# Patient Record
Sex: Female | Born: 1970 | Race: White | Hispanic: No | Marital: Married | Smoking: Never smoker
Health system: Southern US, Community
[De-identification: ages and names within clinical notes are randomized; demographics above are authoritative.]

## PROBLEM LIST (undated history)

## (undated) DIAGNOSIS — H6980 Other specified disorders of Eustachian tube, unspecified ear: Secondary | ICD-10-CM

## (undated) DIAGNOSIS — F411 Generalized anxiety disorder: Secondary | ICD-10-CM

## (undated) DIAGNOSIS — K900 Celiac disease: Secondary | ICD-10-CM

## (undated) DIAGNOSIS — H6990 Unspecified Eustachian tube disorder, unspecified ear: Secondary | ICD-10-CM

## (undated) DIAGNOSIS — I059 Rheumatic mitral valve disease, unspecified: Secondary | ICD-10-CM

## (undated) DIAGNOSIS — G43909 Migraine, unspecified, not intractable, without status migrainosus: Secondary | ICD-10-CM

## (undated) HISTORY — DX: Celiac disease: K90.0

## (undated) HISTORY — DX: Other specified disorders of Eustachian tube, unspecified ear: H69.80

## (undated) HISTORY — DX: Rheumatic mitral valve disease, unspecified: I05.9

## (undated) HISTORY — PX: ABDOMINAL HYSTERECTOMY: SHX81

## (undated) HISTORY — DX: Generalized anxiety disorder: F41.1

## (undated) HISTORY — DX: Unspecified eustachian tube disorder, unspecified ear: H69.90

## (undated) HISTORY — PX: ANKLE SURGERY: SHX546

## (undated) HISTORY — PX: TONSILLECTOMY: SUR1361

## (undated) HISTORY — DX: Migraine, unspecified, not intractable, without status migrainosus: G43.909

---

## 2008-07-18 DIAGNOSIS — I079 Rheumatic tricuspid valve disease, unspecified: Secondary | ICD-10-CM | POA: Insufficient documentation

## 2008-08-21 DIAGNOSIS — F411 Generalized anxiety disorder: Secondary | ICD-10-CM | POA: Insufficient documentation

## 2008-08-21 DIAGNOSIS — H6980 Other specified disorders of Eustachian tube, unspecified ear: Secondary | ICD-10-CM | POA: Insufficient documentation

## 2009-02-20 DIAGNOSIS — G43909 Migraine, unspecified, not intractable, without status migrainosus: Secondary | ICD-10-CM | POA: Insufficient documentation

## 2009-05-30 DIAGNOSIS — H01009 Unspecified blepharitis unspecified eye, unspecified eyelid: Secondary | ICD-10-CM | POA: Insufficient documentation

## 2013-04-19 DIAGNOSIS — K900 Celiac disease: Secondary | ICD-10-CM | POA: Insufficient documentation

## 2014-02-01 ENCOUNTER — Other Ambulatory Visit: Payer: Self-pay | Admitting: Gastroenterology

## 2014-02-01 DIAGNOSIS — R1011 Right upper quadrant pain: Secondary | ICD-10-CM

## 2014-02-01 DIAGNOSIS — R110 Nausea: Secondary | ICD-10-CM

## 2014-02-15 ENCOUNTER — Ambulatory Visit (HOSPITAL_COMMUNITY)
Admission: RE | Admit: 2014-02-15 | Discharge: 2014-02-15 | Disposition: A | Discharge status: Home / Self Care | Payer: 59 | Source: Ambulatory Visit | Attending: Gastroenterology | Admitting: Gastroenterology

## 2014-02-15 DIAGNOSIS — R110 Nausea: Secondary | ICD-10-CM

## 2014-02-15 DIAGNOSIS — R1011 Right upper quadrant pain: Secondary | ICD-10-CM

## 2014-02-15 MED ORDER — TECHNETIUM TC 99M MEBROFENIN IV KIT
5.3000 | PACK | Freq: Once | INTRAVENOUS | Status: AC | PRN
  Administered 2014-02-15: 5 via INTRAVENOUS

## 2017-03-29 DIAGNOSIS — R6882 Decreased libido: Secondary | ICD-10-CM | POA: Insufficient documentation

## 2017-03-29 DIAGNOSIS — N951 Menopausal and female climacteric states: Secondary | ICD-10-CM | POA: Insufficient documentation

## 2019-10-10 ENCOUNTER — Other Ambulatory Visit: Payer: Self-pay

## 2019-10-10 ENCOUNTER — Ambulatory Visit: Payer: 59 | Admitting: Podiatry

## 2019-10-10 ENCOUNTER — Encounter: Payer: Self-pay | Admitting: Podiatry

## 2019-10-10 ENCOUNTER — Ambulatory Visit (INDEPENDENT_AMBULATORY_CARE_PROVIDER_SITE_OTHER): Payer: 59

## 2019-10-10 VITALS — BP 149/94 | HR 87 | Resp 16

## 2019-10-10 DIAGNOSIS — S93402A Sprain of unspecified ligament of left ankle, initial encounter: Secondary | ICD-10-CM

## 2019-10-10 NOTE — Progress Notes (Signed)
  Subjective:  Patient ID: Janice Willis, female    DOB: Feb 12, 1971,  MRN: [id number] HPI Chief Complaint  Patient presents with  . Ankle Injury    Lateral ankle/achilles left - patient fell on Nov 14th and twisted ankle, swollen and bruised, PCP xrayed "no fracture", wearing CAM boot, still very sore and wasn't happy with her follow up visit and would like a 2nd opinion  . New Patient (Initial Visit)    48 y.o. female presents with the above complaint.   ROS: Denies fever chills nausea vomiting muscle aches pains calf pain back pain chest pain shortness of breath.  No past medical history on file.  No current outpatient medications on file.  Allergies  Allergen Reactions  . Codeine Rash  . Fluoxetine Hcl     Flat affect  . Methylprednisolone Hives    Medrol Dose Pack  . Penicillins Rash  . Venlafaxine     Felt like Zombee  . Ciprofloxacin   . Clindamycin/Lincomycin   . Other   . Prednisone   . Propoxyphene   . Sulfamethoxazole-Trimethoprim    Review of Systems Objective:   Vitals:   10/10/19 1546  BP: (!) 149/94  Pulse: 87  Resp: 16    General: Well developed, nourished, in no acute distress, alert and oriented x3   Dermatological: Skin is warm, dry and supple bilateral. Nails x 10 are well maintained; remaining integument appears unremarkable at this time. There are no open sores, no preulcerative lesions, no rash or signs of infection present.  Vascular: Dorsalis Pedis artery and Posterior Tibial artery pedal pulses are 2/4 bilateral with immedate capillary fill time. Pedal hair growth present. No varicosities and no lower extremity edema present bilateral.   Neruologic: Grossly intact via light touch bilateral. Vibratory intact via tuning fork bilateral. Protective threshold with Semmes Wienstein monofilament intact to all pedal sites bilateral. Patellar and Achilles deep tendon reflexes 2+ bilateral. No Babinski or clonus noted bilateral.   Musculoskeletal:  No gross boney pedal deformities bilateral. No pain, crepitus, or limitation noted with foot and ankle range of motion bilateral. Muscular strength 5/5 in all groups tested bilateral.  She has pain primarily on palpation of the lateral malleolus and she has pain on direct palpation of the anterior talofibular ligament.  Calcaneofibular ligament and posterior talofibular ligament not involved.  Gait: Unassisted, Nonantalgic.    Radiographs:  Radiographs taken today demonstrate some soft tissue swelling around the lateral malleolus.  There appears to be a small vertical fracture line from the distal malleolus to a more transverse fracture line proximally at the neck of the fibula inferior aspect.  This is noncomminuted it is not visible on all 3 views.  Lateral view does demonstrate swelling of the anterior ankle.  No displacement of the ankle no talar tilt.  Assessment & Plan:   Assessment: Level 3 ankle sprain left.  Plan: At this point I would like to place her in a compression anklet and then place her in a short cam walker so that she can ambulate more adequately.  We did discuss compression ice therapy rest and elevation.  She understands that and is amenable to it.  We are going to write her limited duty at work.  I will follow-up with her early February to reevaluate and make sure that we do not need to do surgery for this anterior talofibular ligament.  She will call sooner with questions or concerns.     Brylynn Hanssen T. [place], [place]

## 2019-10-25 ENCOUNTER — Encounter: Payer: Self-pay | Admitting: Podiatry

## 2019-10-26 ENCOUNTER — Other Ambulatory Visit: Payer: Self-pay | Admitting: Podiatry

## 2019-10-26 ENCOUNTER — Telehealth: Payer: Self-pay | Admitting: *Deleted

## 2019-10-26 MED ORDER — TRAMADOL HCL 50 MG PO TABS: 50.0000 mg | ORAL_TABLET | Freq: Four times a day (QID) | ORAL | 0 refills | Status: AC | PRN

## 2019-10-26 NOTE — Telephone Encounter (Signed)
Pt states she is having a lot of pain in the ankle is worse and the advil is not helping.

## 2019-10-26 NOTE — Telephone Encounter (Signed)
Left message informing pt the pain medication had been sent to [facility].

## 2019-10-26 NOTE — Telephone Encounter (Signed)
Ill send tramadol

## 2019-10-31 ENCOUNTER — Ambulatory Visit (INDEPENDENT_AMBULATORY_CARE_PROVIDER_SITE_OTHER): Payer: Managed Care, Other (non HMO) | Admitting: Podiatry

## 2019-10-31 ENCOUNTER — Other Ambulatory Visit: Payer: Self-pay

## 2019-10-31 ENCOUNTER — Telehealth: Payer: Self-pay | Admitting: *Deleted

## 2019-10-31 ENCOUNTER — Encounter: Payer: Self-pay | Admitting: Podiatry

## 2019-10-31 DIAGNOSIS — M958 Other specified acquired deformities of musculoskeletal system: Secondary | ICD-10-CM

## 2019-10-31 DIAGNOSIS — S93402A Sprain of unspecified ligament of left ankle, initial encounter: Secondary | ICD-10-CM

## 2019-10-31 DIAGNOSIS — S86312D Strain of muscle(s) and tendon(s) of peroneal muscle group at lower leg level, left leg, subsequent encounter: Secondary | ICD-10-CM

## 2019-10-31 DIAGNOSIS — S93492D Sprain of other ligament of left ankle, subsequent encounter: Secondary | ICD-10-CM

## 2019-10-31 NOTE — Telephone Encounter (Signed)
-----   Message from Kristian Covey, [facility] sent at 10/31/2019 12:05 PM EST ----- Regarding: MRI MRI left ankle - evaluate for osteochondral defect, ATFL and peroneal tears left ankle - surgical consideration

## 2019-10-31 NOTE — Telephone Encounter (Signed)
Orders to A. Prevette, CMA for pre-cert, faxed to [facility]. 

## 2019-11-01 ENCOUNTER — Encounter: Payer: Self-pay | Admitting: Podiatry

## 2019-11-01 NOTE — Progress Notes (Signed)
She presents today for follow-up of her left ankle.  She has been wearing the cam walker but states that the injury that she sustained November 14 when she fell down her steps at home as really started become more painful and is starting to affect her ability to perform any activities.  She saw her primary doctor on 16 November who examined her and told her she wanted her to start wearing a brace.  She examined it again on the 19th and wanted her to start physical therapy.  She then came to [place] on the 22nd.  Objective: Vital signs are stable she is alert and oriented x3 pulses are palpable.  Left ankle remains swollen she has exquisite tenderness overlying the anterior talofibular ligament and the peroneal tendons.  Medial aspect of the foot appears to be good though she does have some tenderness on dorsiflexion and plantarflexion of the foot near the anterior lateral ankle.  Possible osteochondral defect at that point.  Assessment: Cannot rule out osteochondral defect a tear of the anterior talofibular ligament and peroneal tendons.  Plan: Failure to improve over 2 months.  We should go ahead and get an MRI of this ankle to make sure there is no surgical implications.

## 2019-11-05 ENCOUNTER — Encounter: Payer: Self-pay | Admitting: Podiatry

## 2019-11-07 ENCOUNTER — Other Ambulatory Visit: Payer: Self-pay | Admitting: Podiatry

## 2019-11-07 ENCOUNTER — Ambulatory Visit: Payer: 59 | Admitting: Podiatry

## 2019-11-07 MED ORDER — OXYCODONE-ACETAMINOPHEN 10-325 MG PO TABS: 1.0000 | ORAL_TABLET | Freq: Three times a day (TID) | ORAL | 0 refills | Status: AC | PRN

## 2019-11-08 NOTE — Telephone Encounter (Signed)
Orders and [employer] referral # faxed to [facility].

## 2019-11-08 NOTE — Telephone Encounter (Signed)
According to [employer] ([phone]) this MRI does not require pre-cert or prior authorization. I asked for a reference number for this and representative said to use her name with the date...  Ref: Janice Willis 11/08/2019

## 2019-11-10 ENCOUNTER — Encounter: Payer: Self-pay | Admitting: Podiatry

## 2019-11-10 ENCOUNTER — Other Ambulatory Visit: Payer: Self-pay

## 2019-11-10 ENCOUNTER — Ambulatory Visit
Admission: RE | Admit: 2019-11-10 | Discharge: 2019-11-10 | Disposition: A | Discharge status: Home / Self Care | Payer: 59 | Source: Ambulatory Visit | Attending: Podiatry | Admitting: Podiatry

## 2019-11-14 ENCOUNTER — Telehealth: Payer: Self-pay | Admitting: *Deleted

## 2019-11-14 NOTE — Telephone Encounter (Signed)
Fine with me

## 2019-11-14 NOTE — Telephone Encounter (Signed)
Mailed copy of the MRI disc to [facility].

## 2019-11-14 NOTE — Telephone Encounter (Signed)
I called pt and informed that Dr. Al Corpus had okayed the extension of out of work date to 11/28/2019. Pt asked if the MRI results were inconclusive is why the MRI was being sent out. I told pt Dr. Al Corpus will often send the MRI disc copy to the specialist for more details to plan treatment. Pt states the man that talked with her this morning gave her a surgery book and pack. I told her I was not aware of that and it may have been sent by the surgery coordinator. I told pt I would have her letter ready for pick up.

## 2019-11-14 NOTE — Telephone Encounter (Signed)
I spoke with pt and informed of Dr. Geryl Rankins request to send copy of the MRI disc to a radiology specialist for more details for treatment planning, there would be a 10 -14 day delay in the final results, and once reviewed I would call with instructions. Pt request to be out of work until 2 weeks for the results to return.

## 2019-11-14 NOTE — Telephone Encounter (Signed)
-----   Message from Elinor Parkinson, [place] sent at 11/14/2019  7:10 AM EST ----- Please send for over read and inform patient of the delay.

## 2019-11-23 ENCOUNTER — Encounter: Payer: Self-pay | Admitting: Podiatry

## 2019-11-27 ENCOUNTER — Encounter: Payer: Self-pay | Admitting: Podiatry

## 2019-11-29 ENCOUNTER — Encounter: Payer: Self-pay | Admitting: Podiatry

## 2019-11-30 ENCOUNTER — Encounter: Payer: Self-pay | Admitting: Podiatry

## 2019-12-06 ENCOUNTER — Encounter: Payer: Self-pay | Admitting: Podiatry

## 2019-12-12 ENCOUNTER — Encounter: Payer: Self-pay | Admitting: Podiatry

## 2019-12-14 ENCOUNTER — Telehealth: Payer: Self-pay | Admitting: Podiatry

## 2019-12-14 ENCOUNTER — Other Ambulatory Visit: Payer: Self-pay

## 2019-12-14 ENCOUNTER — Encounter: Payer: Self-pay | Admitting: Podiatry

## 2019-12-14 ENCOUNTER — Ambulatory Visit (INDEPENDENT_AMBULATORY_CARE_PROVIDER_SITE_OTHER): Payer: Managed Care, Other (non HMO) | Admitting: Podiatry

## 2019-12-14 VITALS — Temp 97.1°F

## 2019-12-14 DIAGNOSIS — S93492D Sprain of other ligament of left ankle, subsequent encounter: Secondary | ICD-10-CM | POA: Diagnosis not present

## 2019-12-14 DIAGNOSIS — S86312D Strain of muscle(s) and tendon(s) of peroneal muscle group at lower leg level, left leg, subsequent encounter: Secondary | ICD-10-CM

## 2019-12-14 NOTE — Telephone Encounter (Signed)
I saw Dr. Al Corpus this morning and I'm calling to schedule my surgery. I asked the pt if there was a certain date she was looking at as Dr. Al Corpus does surgeries on Friday's. Pt requested his next available so I scheduled her for Friday, 03/12. I told her to go ahead and register online with the sx center and that she would get a call from someone at the sx center a day or two prior to her sx date.

## 2019-12-14 NOTE — Patient Instructions (Signed)
Pre-Operative Instructions  Congratulations, you have decided to take an important step towards improving your quality of life.  You can be assured that the doctors and staff at [facility] will be with you every step of the way.  Here are some important things you should know:  1. Plan to be at the surgery center/hospital at least 1 (one) hour prior to your scheduled time, unless otherwise directed by the surgical center/hospital staff.  You must have a responsible adult accompany you, remain during the surgery and drive you home.  Make sure you have directions to the surgical center/hospital to ensure you arrive on time. 2. If you are having surgery at [facility] or [facility], you will need a copy of your medical history and physical form from your family physician within one month prior to the date of surgery. We will give you a form for your primary physician to complete.  3. We make every effort to accommodate the date you request for surgery.  However, there are times where surgery dates or times have to be moved.  We will contact you as soon as possible if a change in schedule is required.   4. No aspirin/ibuprofen for one week before surgery.  If you are on aspirin, any non-steroidal anti-inflammatory medications (Mobic, Aleve, Ibuprofen) should not be taken seven (7) days prior to your surgery.  You make take Tylenol for pain prior to surgery.  5. Medications - If you are taking daily heart and blood pressure medications, seizure, reflux, allergy, asthma, anxiety, pain or diabetes medications, make sure you notify the surgery center/hospital before the day of surgery so they can tell you which medications you should take or avoid the day of surgery. 6. No food or drink after midnight the night before surgery unless directed otherwise by surgical center/hospital staff. 7. No alcoholic beverages 24-hours prior to surgery.  No smoking 24-hours prior or 24-hours after  surgery. 8. Wear loose pants or shorts. They should be loose enough to fit over bandages, boots, and casts. 9. Don't wear slip-on shoes. Sneakers are preferred. 10. Bring your boot with you to the surgery center/hospital.  Also bring crutches or a walker if your physician has prescribed it for you.  If you do not have this equipment, it will be provided for you after surgery. 11. If you have not been contacted by the surgery center/hospital by the day before your surgery, call to confirm the date and time of your surgery. 12. Leave-time from work may vary depending on the type of surgery you have.  Appropriate arrangements should be made prior to surgery with your employer. 13. Prescriptions will be provided immediately following surgery by your doctor.  Fill these as soon as possible after surgery and take the medication as directed. Pain medications will not be refilled on weekends and must be approved by the doctor. 14. Remove nail polish on the operative foot and avoid getting pedicures prior to surgery. 15. Wash the night before surgery.  The night before surgery wash the foot and leg well with water and the antibacterial soap provided. Be sure to pay special attention to beneath the toenails and in between the toes.  Wash for at least three (3) minutes. Rinse thoroughly with water and dry well with a towel.  Perform this wash unless told not to do so by your physician.  Enclosed: 1 Ice pack (please put in freezer the night before surgery)   1 Hibiclens skin cleaner     Pre-op instructions  If you have any questions regarding the instructions, please do not hesitate to call our office.  [place]: [address], [place], [place] [address] -- [phone]  [place]: [address] [place], [place] [address] -- [phone]  [place]: [address], [place], [place] [address] -- [phone]   Website: [email or web address] 

## 2019-12-14 NOTE — Progress Notes (Signed)
She presents today for follow-up of her lateral ankle pain.  States that her pain has not improved at all.  Objective: Vital signs are stable she is alert and oriented x3 pulses are palpable.  She still has severe pain on palpation anterior talofibular ligament area as well as peroneal tendons.  MRI does suggest a tear of the anterior talofibular ligament and a tear of the peroneus brevis left.  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  Plan: Discussed etiology pathology conservative surgical therapies at this point in time consented her for a primary repair of the talofibular ligament with peroneal tendon repair left and cast application left.  We discussed the pros and cons of the surgery and the possible side effects which may include but not limited to postop pain bleeding swelling infection recurrence need for further surgery overcorrection under correction also digit loss of limb loss of life.  She understands that she will need a knee scooter postoperatively she understands that she will be in a cast for no less than 4 weeks.  She signed a 3 page of the consent form I will follow-up with her in the near future for surgical intervention.

## 2019-12-15 ENCOUNTER — Telehealth: Payer: Self-pay | Admitting: Podiatry

## 2019-12-15 NOTE — Telephone Encounter (Signed)
DOS: 12/29/2019  SURGICAL PROCEDURES: Repair Anterior Talo Fibular Ligament(ATFL) Left (95583), Repair Peroneal Tendon Left (16742), and Cast Application Left.  [facility] Effective: 10/20/2019 -   Deductible: $0/waived Out of Pocket: $5,000 with $109.62 met and $4,890.38 remains. CoInsurance: 100% / 0%  Per Sissy Hoff prior authorization is required. Call ref# [id number].  Prior authorization phone# [phone].  Per [employer] S no prior authorization is required. Call ref#[id number].

## 2019-12-28 ENCOUNTER — Other Ambulatory Visit: Payer: Self-pay | Admitting: Podiatry

## 2019-12-28 MED ORDER — HYDROMORPHONE HCL 4 MG PO TABS: 4.0000 mg | ORAL_TABLET | Freq: Four times a day (QID) | ORAL | 0 refills | Status: AC | PRN

## 2019-12-28 MED ORDER — ONDANSETRON HCL 4 MG PO TABS: 4.0000 mg | ORAL_TABLET | Freq: Three times a day (TID) | ORAL | 0 refills | Status: DC | PRN

## 2019-12-28 MED ORDER — DOXYCYCLINE HYCLATE 100 MG PO TABS: 100.0000 mg | ORAL_TABLET | Freq: Two times a day (BID) | ORAL | 0 refills | Status: DC

## 2019-12-29 ENCOUNTER — Encounter: Payer: Self-pay | Admitting: Podiatry

## 2019-12-29 DIAGNOSIS — M65872 Other synovitis and tenosynovitis, left ankle and foot: Secondary | ICD-10-CM

## 2020-01-02 ENCOUNTER — Encounter: Payer: Self-pay | Admitting: Podiatry

## 2020-01-02 MED ORDER — CYCLOBENZAPRINE HCL 10 MG PO TABS: ORAL_TABLET | ORAL | 0 refills | Status: AC

## 2020-01-02 NOTE — Telephone Encounter (Signed)
Pt states she has a spasm in her leg when she goes to sleep and it will wake her. I told her that was not unusual, the muscle and tendon are held at a specific tension in the cast and when she is relaxed at sleep even in the cast the tension is also relaxed, causing spasms. I told pt that I would inform Dr. Al Corpus.

## 2020-01-04 ENCOUNTER — Ambulatory Visit (INDEPENDENT_AMBULATORY_CARE_PROVIDER_SITE_OTHER): Payer: Managed Care, Other (non HMO) | Admitting: Podiatry

## 2020-01-04 ENCOUNTER — Other Ambulatory Visit: Payer: Self-pay

## 2020-01-04 ENCOUNTER — Encounter: Payer: Self-pay | Admitting: Podiatry

## 2020-01-04 VITALS — Temp 98.0°F

## 2020-01-04 DIAGNOSIS — M958 Other specified acquired deformities of musculoskeletal system: Secondary | ICD-10-CM

## 2020-01-04 DIAGNOSIS — Z9889 Other specified postprocedural states: Secondary | ICD-10-CM

## 2020-01-04 DIAGNOSIS — S93492D Sprain of other ligament of left ankle, subsequent encounter: Secondary | ICD-10-CM

## 2020-01-04 DIAGNOSIS — S86312D Strain of muscle(s) and tendon(s) of peroneal muscle group at lower leg level, left leg, subsequent encounter: Secondary | ICD-10-CM

## 2020-01-04 NOTE — Progress Notes (Signed)
She presents today for her first postop visit date of surgery December 29, 2019 repair anterior talofibular ligament and repair of peroneal tendon with cast.  She states that it feels okay for the most part.  She presents with her knee scooter.  She denies fever chills nausea vomiting muscle aches pains calf pain back pain chest pain shortness of breath.  Objective: Vital signs are stable alert and oriented x3.  She has good sensation to her toes she has good range of motion of the toes no signs of infection.  Loose around the top is the cast and firm around the ankle.  Assessment: Well-healing surgical foot 1 week.  Plan: Remove the cast next week and apply another one.

## 2020-01-11 ENCOUNTER — Other Ambulatory Visit: Payer: Self-pay

## 2020-01-11 ENCOUNTER — Ambulatory Visit (INDEPENDENT_AMBULATORY_CARE_PROVIDER_SITE_OTHER): Payer: Managed Care, Other (non HMO) | Admitting: Podiatry

## 2020-01-11 DIAGNOSIS — M958 Other specified acquired deformities of musculoskeletal system: Secondary | ICD-10-CM

## 2020-01-11 DIAGNOSIS — Z9889 Other specified postprocedural states: Secondary | ICD-10-CM

## 2020-01-11 DIAGNOSIS — S93492D Sprain of other ligament of left ankle, subsequent encounter: Secondary | ICD-10-CM

## 2020-01-11 DIAGNOSIS — S86312D Strain of muscle(s) and tendon(s) of peroneal muscle group at lower leg level, left leg, subsequent encounter: Secondary | ICD-10-CM

## 2020-01-11 NOTE — Progress Notes (Signed)
She presents today for cast change date of surgery 12/29/2019 status post Brostrm repair of the anterior talofibular ligament and primary repair of the peroneus longus tendon left foot.  She denies fever chills nausea vomiting muscle aches pain states she is doing quite well.  Objective: Vital signs are stable she is alert and oriented x3 dressed her dressing and cast were removed demonstrating minimal edema no erythema cellulitis drainage or odor staples are intact sutures are intact margins appear to be well coapted.  She has good range of motion with mild tenderness.  Assessment: Well-healing surgical foot left.  Plan: Redressed today dressed a compressive dressing placed her back and a cast for another 2 weeks nonweightbearing status continue to keep this elevated keep it dry and clean.  Follow-up with her in 2 weeks for cast removal and application of a cam walker.

## 2020-01-25 ENCOUNTER — Ambulatory Visit (INDEPENDENT_AMBULATORY_CARE_PROVIDER_SITE_OTHER): Payer: Managed Care, Other (non HMO) | Admitting: Podiatry

## 2020-01-25 ENCOUNTER — Encounter: Payer: Self-pay | Admitting: Podiatry

## 2020-01-25 ENCOUNTER — Other Ambulatory Visit: Payer: Self-pay

## 2020-01-25 DIAGNOSIS — S86312D Strain of muscle(s) and tendon(s) of peroneal muscle group at lower leg level, left leg, subsequent encounter: Secondary | ICD-10-CM

## 2020-01-25 DIAGNOSIS — S93492D Sprain of other ligament of left ankle, subsequent encounter: Secondary | ICD-10-CM | POA: Diagnosis not present

## 2020-01-25 DIAGNOSIS — Z9889 Other specified postprocedural states: Secondary | ICD-10-CM

## 2020-01-25 DIAGNOSIS — M958 Other specified acquired deformities of musculoskeletal system: Secondary | ICD-10-CM

## 2020-01-25 NOTE — Progress Notes (Signed)
She presents today date of surgery 12/29/2019 repair of anterior talofibular ligament and repair of peroneal tendon left.  She denies fever chills nausea vomiting muscle aches and pain states that she has not walked on the foot.  And she continues to utilize her knee scooter and her cast.  Denies calf pain back pain chest pain shortness of breath.  Objective: Vital signs are stable alert oriented x3 cast is intact was removed demonstrates dry sterile dressing intact was removed demonstrates sutures are intact to the lateral ankle and appears to be healing very nicely.  Staples were removed today margins remain well coapted no signs of infection.  She has some mild edema she has good dorsiflexion and plantarflexion though it is tight and tender.  She has good neurologic sensorium to light touch I see no signs of infection no signs of weakness of she has plantarflexion against resistance she has dorsiflexion against resistance and abduction against resistance.  Assessment: Well-healing surgical foot mild edema.  Plan: Encourage range of motion exercises.  Placing her in a a compression anklet in a cam boot.  Encourage nonweightbearing I will let her start getting this wet tomorrow I will let her stand static and I will follow-up with her in 2 weeks

## 2020-02-08 ENCOUNTER — Ambulatory Visit (INDEPENDENT_AMBULATORY_CARE_PROVIDER_SITE_OTHER): Payer: No Typology Code available for payment source | Admitting: Podiatry

## 2020-02-08 ENCOUNTER — Encounter: Payer: Self-pay | Admitting: Podiatry

## 2020-02-08 ENCOUNTER — Other Ambulatory Visit: Payer: Self-pay

## 2020-02-08 DIAGNOSIS — Z9889 Other specified postprocedural states: Secondary | ICD-10-CM

## 2020-02-08 DIAGNOSIS — M958 Other specified acquired deformities of musculoskeletal system: Secondary | ICD-10-CM

## 2020-02-08 DIAGNOSIS — S93492D Sprain of other ligament of left ankle, subsequent encounter: Secondary | ICD-10-CM

## 2020-02-08 DIAGNOSIS — S86312D Strain of muscle(s) and tendon(s) of peroneal muscle group at lower leg level, left leg, subsequent encounter: Secondary | ICD-10-CM

## 2020-02-08 NOTE — Progress Notes (Signed)
She presents today date of surgery 12/29/2019 status post repair of anterior talofibular ligament left and repair of peroneal tendon left.  States that her left heel has been bothering her when she does try to stand.  She states that her ankle really has not bothered her much at all.  Has noticed some swelling.  Does not have any calf pain back pain chest pain or shortness of breath.  Continues to wear the cam walker at all times other than when she is doing her ankle exercises.  Objective: Vital signs are stable alert and oriented x3 there is no erythema just mild edema no cellulitis drainage or odor incision sites abdominal to heal uneventfully she has good dorsiflexion and plantarflexion against resistance inversion is tight and eversion is tight.  Assessment: Well-healing surgical foot.  Plan: At this point I will have her start walking with her cam walker.  She is to get away from her crutches and her knee scooter at this time.  I will follow-up with her in about 2 weeks at which time we will consider shoes and a Tri-Lock brace or consider physical therapy.

## 2020-02-27 ENCOUNTER — Other Ambulatory Visit: Payer: Self-pay

## 2020-02-27 ENCOUNTER — Encounter: Payer: Self-pay | Admitting: Podiatry

## 2020-02-27 ENCOUNTER — Telehealth: Payer: Self-pay | Admitting: *Deleted

## 2020-02-27 ENCOUNTER — Ambulatory Visit (INDEPENDENT_AMBULATORY_CARE_PROVIDER_SITE_OTHER): Payer: No Typology Code available for payment source | Admitting: Podiatry

## 2020-02-27 DIAGNOSIS — Z9889 Other specified postprocedural states: Secondary | ICD-10-CM

## 2020-02-27 DIAGNOSIS — S93492D Sprain of other ligament of left ankle, subsequent encounter: Secondary | ICD-10-CM | POA: Diagnosis not present

## 2020-02-27 DIAGNOSIS — S86312D Strain of muscle(s) and tendon(s) of peroneal muscle group at lower leg level, left leg, subsequent encounter: Secondary | ICD-10-CM

## 2020-02-27 DIAGNOSIS — L301 Dyshidrosis [pompholyx]: Secondary | ICD-10-CM

## 2020-02-27 DIAGNOSIS — M958 Other specified acquired deformities of musculoskeletal system: Secondary | ICD-10-CM

## 2020-02-27 MED ORDER — CLOBETASOL PROPIONATE 0.05 % EX CREA: 1.0000 "application " | TOPICAL_CREAM | Freq: Two times a day (BID) | CUTANEOUS | 1 refills | Status: DC

## 2020-02-27 NOTE — Telephone Encounter (Signed)
-----   Message from Kristian Covey, [facility] sent at 02/27/2020 11:12 AM EDT ----- Regarding: PT [facility] - in house  S/p DOS 3.12.21 Repair Anterior Talo Fib ligament, left   Increase mobility and ROM, decrease pain  Duration: 3 x week x 4 weeks

## 2020-02-27 NOTE — Telephone Encounter (Signed)
Delivered orders to [facility].
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
## 2020-03-12 ENCOUNTER — Other Ambulatory Visit: Payer: Self-pay

## 2020-03-12 ENCOUNTER — Ambulatory Visit (INDEPENDENT_AMBULATORY_CARE_PROVIDER_SITE_OTHER): Payer: No Typology Code available for payment source | Admitting: Podiatry

## 2020-03-12 VITALS — Temp 96.6°F

## 2020-03-12 DIAGNOSIS — Z9889 Other specified postprocedural states: Secondary | ICD-10-CM

## 2020-03-12 DIAGNOSIS — M958 Other specified acquired deformities of musculoskeletal system: Secondary | ICD-10-CM

## 2020-03-12 DIAGNOSIS — S86312D Strain of muscle(s) and tendon(s) of peroneal muscle group at lower leg level, left leg, subsequent encounter: Secondary | ICD-10-CM

## 2020-03-12 DIAGNOSIS — S93492D Sprain of other ligament of left ankle, subsequent encounter: Secondary | ICD-10-CM
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
## 2020-04-11 ENCOUNTER — Other Ambulatory Visit: Payer: Self-pay

## 2020-04-11 ENCOUNTER — Other Ambulatory Visit: Payer: Self-pay | Admitting: *Deleted

## 2020-04-11 ENCOUNTER — Encounter: Payer: Self-pay | Admitting: Podiatry

## 2020-04-11 ENCOUNTER — Ambulatory Visit (INDEPENDENT_AMBULATORY_CARE_PROVIDER_SITE_OTHER): Payer: No Typology Code available for payment source | Admitting: Podiatry

## 2020-04-11 DIAGNOSIS — S93492D Sprain of other ligament of left ankle, subsequent encounter: Secondary | ICD-10-CM

## 2020-04-11 DIAGNOSIS — S86312D Strain of muscle(s) and tendon(s) of peroneal muscle group at lower leg level, left leg, subsequent encounter: Secondary | ICD-10-CM

## 2020-04-11 DIAGNOSIS — Z9889 Other specified postprocedural states: Secondary | ICD-10-CM

## 2020-04-11 DIAGNOSIS — M958 Other specified acquired deformities of musculoskeletal system: Secondary | ICD-10-CM

## 2020-04-14 NOTE — Progress Notes (Signed)
She presents today's date of surgery December 29, 2019 she is concerned about the color changes of the foot states that the foot feels pretty good for the most part and less physical therapy has really overworked her.  Objective: Vital signs are stable she is alert and oriented x3.  Pulses are palpable.  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  Assessment: Well-healing surgical foot and ankle.  Plan: We will allow her to get back to work light duty and I will follow-up with her in a couple of weeks.

## 2020-05-05 ENCOUNTER — Encounter: Payer: Self-pay | Admitting: Podiatry

## 2020-05-07 ENCOUNTER — Other Ambulatory Visit: Payer: Self-pay

## 2020-05-07 ENCOUNTER — Ambulatory Visit (INDEPENDENT_AMBULATORY_CARE_PROVIDER_SITE_OTHER): Payer: No Typology Code available for payment source | Admitting: Podiatry

## 2020-05-07 ENCOUNTER — Encounter: Payer: Self-pay | Admitting: Podiatry

## 2020-05-07 DIAGNOSIS — Z9889 Other specified postprocedural states: Secondary | ICD-10-CM | POA: Diagnosis not present

## 2020-05-07 DIAGNOSIS — S86312D Strain of muscle(s) and tendon(s) of peroneal muscle group at lower leg level, left leg, subsequent encounter: Secondary | ICD-10-CM | POA: Diagnosis not present

## 2020-05-07 DIAGNOSIS — S93492D Sprain of other ligament of left ankle, subsequent encounter: Secondary | ICD-10-CM

## 2020-05-07 DIAGNOSIS — M958 Other specified acquired deformities of musculoskeletal system: Secondary | ICD-10-CM

## 2020-05-07 MED ORDER — IBUPROFEN 800 MG PO TABS: 800.0000 mg | ORAL_TABLET | Freq: Three times a day (TID) | ORAL | 5 refills | Status: DC

## 2020-05-08 ENCOUNTER — Telehealth: Payer: Self-pay | Admitting: Podiatry

## 2020-05-08 ENCOUNTER — Encounter: Payer: Self-pay | Admitting: Podiatry

## 2020-05-08 NOTE — Telephone Encounter (Signed)
Pt called stating that she decided to stay at home off the ankle pt stated that she would need a work note. What would you like for me to put on pt note to be out of work please assist

## 2020-05-08 NOTE — Telephone Encounter (Signed)
Pt will be out of work until next appointment due to increasing pain and symptoms in the surgical foot.  Give the date of her next appointment.

## 2020-05-08 NOTE — Progress Notes (Signed)
She presents today date of surgery is 12/29/2019 is a Mell more than 4 months out at this point.  Anterior talofibular ligament repair and peroneal tendon repair.  She states that she was doing pretty well until she went back to work she feels.  States that she noticed a lot of swelling at that time and has started to develop pain along the peroneal distribution where the surgery was performed.  She states that the pain actually radiates further now down to the fifth metatarsal base.  When asked if she had had any trauma it for she states no she had no trauma and then goes on to explain a time when she was wearing her tennis shoes without her brace and slipped at her sister's house causing her to jerk her foot aggressively and she states at that point she felt pain along that area that is currently now painful regularly.  She states that the first time she felt that pain since surgery.  She states that current pain level is rival that of presurgical condition.  She states that her anterior ankle/anterior lateral ankle repair is doing very well.  Objective: Vital signs are stable she is alert and oriented x3 anterior talofibular ligament repair appears to be doing fine it is intact there is no pain.  She still has considerable limitation on inversion which we would expect with the lateral ankle repair.  Also she does have pain on palpation of the scar from the peroneal tendon repair and superior to the scar and distal to the scar is also painful.  She has pain on abduction against resistance.  Obviously this appears to be peroneus brevis once again however it very well could be the edema that is present.  She does have considerable pitting edema along the lateral aspect of the foot and leg she has no calf pain and no warmth.  Assessment: Possibly simple tendinitis.  Or we could have tearing proximal and distal to the previous repair.  Anterior talofibular ligament repair is good.  Plan: Discussed etiology  pathology conservative surgical therapies since she cannot take prednisone I recommended that she get back into her her brace and start on 800 mg of ibuprofen 3 times a day with food.  I would like for her to be out of work but she is concerned about her job at this point.  If she is not feeling better in 3 weeks we will send for another MRI to evaluate the peroneal tendons.

## 2020-05-23 ENCOUNTER — Ambulatory Visit: Payer: No Typology Code available for payment source | Admitting: Podiatry

## 2020-05-28 ENCOUNTER — Encounter: Payer: Self-pay | Admitting: Podiatry

## 2020-05-28 ENCOUNTER — Other Ambulatory Visit: Payer: Self-pay

## 2020-05-28 ENCOUNTER — Ambulatory Visit (INDEPENDENT_AMBULATORY_CARE_PROVIDER_SITE_OTHER): Payer: No Typology Code available for payment source | Admitting: Podiatry

## 2020-05-28 DIAGNOSIS — S86312A Strain of muscle(s) and tendon(s) of peroneal muscle group at lower leg level, left leg, initial encounter: Secondary | ICD-10-CM

## 2020-05-28 MED ORDER — MELOXICAM 15 MG PO TABS: 15.0000 mg | ORAL_TABLET | Freq: Every day | ORAL | 3 refills | Status: DC

## 2020-05-29 NOTE — Progress Notes (Signed)
She presents today for follow-up of her left ankle pain.  She has had continued pain since a few weeks after surgery of her left ankle.  Date of surgery 12/29/2019 states the majority of the pain is now proximal to the incision and distal to the incision for anterior talofibular ligament repair however is doing perfectly has no pain with that whatsoever.  She only has tenderness with the peroneal tendon.  Objective: Vital signs are stable she is alert oriented x3 has pain on abduction against resistance she has pain on palpation of the entire length of the peroneal tendon.  There is moderate swelling in this area.  She has no pain on palpation of the anterior talofibular ligament repair.  Assessment: Well-healing anterior talofibular ligament however she does have chronic pain that has developed in the peroneal tendon left.  Plan: Most likely she has a continued tear of the peroneal tendon proximally and distally.  This occurred after an injury at her sister's house.  We are requesting follow-up MRI for surgical preplanning.

## 2020-05-30 ENCOUNTER — Telehealth: Payer: Self-pay | Admitting: *Deleted

## 2020-05-30 DIAGNOSIS — S86312A Strain of muscle(s) and tendon(s) of peroneal muscle group at lower leg level, left leg, initial encounter: Secondary | ICD-10-CM

## 2020-05-30 DIAGNOSIS — S93492D Sprain of other ligament of left ankle, subsequent encounter: Secondary | ICD-10-CM

## 2020-05-30 DIAGNOSIS — S86312D Strain of muscle(s) and tendon(s) of peroneal muscle group at lower leg level, left leg, subsequent encounter: Secondary | ICD-10-CM

## 2020-05-30 NOTE — Telephone Encounter (Signed)
-----   Message from Kristian Covey, [facility] sent at 05/30/2020  8:42 AM EDT ----- Regarding: MRI MRI left ankle - evaluate peroneal tendon tear left - surgical consideration

## 2020-05-30 NOTE — Telephone Encounter (Signed)
Faxed orders, clinicals and demographics to [facility] for pre-cert and scheduling. 

## 2020-05-31 ENCOUNTER — Other Ambulatory Visit: Payer: Self-pay | Admitting: Podiatry

## 2020-06-10 ENCOUNTER — Other Ambulatory Visit: Payer: Self-pay

## 2020-06-10 ENCOUNTER — Ambulatory Visit
Admission: RE | Admit: 2020-06-10 | Discharge: 2020-06-10 | Disposition: A | Discharge status: Home / Self Care | Payer: PRIVATE HEALTH INSURANCE | Source: Ambulatory Visit | Attending: Podiatry | Admitting: Podiatry

## 2020-06-11 ENCOUNTER — Encounter: Payer: Self-pay | Admitting: Podiatry

## 2020-06-17 ENCOUNTER — Telehealth: Payer: Self-pay | Admitting: Podiatry

## 2020-06-17 NOTE — Telephone Encounter (Signed)
I responded to this on  8/25.  Dont know if you have already taken care of this or not but I would like to have her in soon for discussion regarding the tear in the tendon.  May have to be next week.

## 2020-06-17 NOTE — Telephone Encounter (Signed)
Left voicemail for patient to call office and schedule appt for MRI Results.

## 2020-06-19 ENCOUNTER — Other Ambulatory Visit: Payer: PRIVATE HEALTH INSURANCE

## 2020-06-20 ENCOUNTER — Ambulatory Visit (INDEPENDENT_AMBULATORY_CARE_PROVIDER_SITE_OTHER): Payer: No Typology Code available for payment source | Admitting: Podiatry

## 2020-06-20 ENCOUNTER — Other Ambulatory Visit: Payer: Self-pay

## 2020-06-20 ENCOUNTER — Encounter: Payer: Self-pay | Admitting: Podiatry

## 2020-06-20 DIAGNOSIS — S86312D Strain of muscle(s) and tendon(s) of peroneal muscle group at lower leg level, left leg, subsequent encounter: Secondary | ICD-10-CM | POA: Diagnosis not present

## 2020-06-20 NOTE — Progress Notes (Signed)
She presents today for follow-up of a painful left ankle.  She states that is still painful but as long as she wears her brace it feels better.  She states that her insurance company is still saying that we never sent information that we actually sent the information not only to her insurance company but we also sent it to her boss who then sent to [employer].  Evaluation of the foot does demonstrate edema to the left foot and pain on palpation of the peroneal tendons proximal to her previous incision and distal to her previous incision.  Her MRI does state that there is a tear distal to the repair of her previous incision and this very well could have been there for we did surgery or it could have become from when she was at her sister's house and nearly fell on her foot.  Assessment: Tear proximal and distal peroneal tendon.  Plan: At this point we consented her for redo of the repair of the peroneal tendon including more proximal portion as well as the distal portion and did discuss in great detail the possible need for anastomosis of these 2 tendons/tenodesis of these 2 tendons.  She understands and is amenable to it if necessary.  I will go to try to utilize a cam walker but a cast may be necessary.

## 2020-07-18 ENCOUNTER — Encounter: Payer: Self-pay | Admitting: Podiatry

## 2020-08-22 ENCOUNTER — Telehealth: Payer: Self-pay

## 2020-08-22 NOTE — Telephone Encounter (Signed)
DOS 09/06/2020  REPAIR PERONEAL TENDON LT - 03888  AETNA/MERITAIN EFFECTIVE DATE - 10/20/2019  PLAN DEDUCTIBLE - $0.00 OUT OF POCKET - $5000.00 COPAY $0.00 COINSURANCE - 100%  SPOKE TO CINDY AT [facility]. SHE STATED NO PRECERT IS REQUIRED FOR CPT 28086.CALL REF # [id number] 08/22/2020

## 2020-08-27 ENCOUNTER — Ambulatory Visit: Payer: No Typology Code available for payment source | Admitting: Podiatry

## 2020-08-27 ENCOUNTER — Encounter: Payer: Self-pay | Admitting: Podiatry

## 2020-08-29 ENCOUNTER — Other Ambulatory Visit: Payer: Self-pay

## 2020-08-29 ENCOUNTER — Ambulatory Visit (INDEPENDENT_AMBULATORY_CARE_PROVIDER_SITE_OTHER): Payer: No Typology Code available for payment source | Admitting: Podiatry

## 2020-08-29 ENCOUNTER — Encounter: Payer: Self-pay | Admitting: Podiatry

## 2020-08-29 DIAGNOSIS — S86312S Strain of muscle(s) and tendon(s) of peroneal muscle group at lower leg level, left leg, sequela: Secondary | ICD-10-CM

## 2020-08-31 NOTE — Progress Notes (Signed)
She presents today to discuss surgery once again about her left ankle.  She states that she just has questions and would like for me to go over the plan once again.  Previously she had had a peroneal tendon repair at the same time she had a repair of the anterior lateral ankle.  Both of which turned out well until she injured her foot at her sister's house while still recovering from the peroneal tendon.  After treatment failed to resolve her symptoms an MRI was performed and noted that the tubularization that was performed is still intact however her condition has worsened distal to the repair.  Objective: Vital signs are stable alert and oriented x3.  There is no change in her past medical history medications allergies surgery social history.  Pulses are strongly palpable no edema no erythema cellulitis drainage or odor chest pain on palpation to the distal aspect of her incision.  MRI does relate that the majority of the pain should be by the arcuate portion of the cuboid which it is.  MRI states that there is a tear at this point.  Assessment: Tear distal to primary repair repair of the peroneus longus tendon left.  Left anterolateral ankle repair is healing well.  Plan: Discussed etiology pathology and surgical therapies once again went over surgical planned for a tenodesis if necessary she understands this and is amenable to it I would like to follow-up with her in the near future.

## 2020-09-04 ENCOUNTER — Other Ambulatory Visit: Payer: Self-pay | Admitting: Podiatry

## 2020-09-04 MED ORDER — HYDROMORPHONE HCL 4 MG PO TABS: 4.0000 mg | ORAL_TABLET | ORAL | 0 refills | Status: DC | PRN

## 2020-09-04 MED ORDER — ONDANSETRON HCL 4 MG PO TABS: 4.0000 mg | ORAL_TABLET | Freq: Three times a day (TID) | ORAL | 0 refills | Status: AC | PRN

## 2020-09-04 MED ORDER — DOXYCYCLINE HYCLATE 100 MG PO TABS: 100.0000 mg | ORAL_TABLET | Freq: Two times a day (BID) | ORAL | 0 refills | Status: DC

## 2020-09-06 DIAGNOSIS — S86322D Laceration of muscle(s) and tendon(s) of peroneal muscle group at lower leg level, left leg, subsequent encounter: Secondary | ICD-10-CM

## 2020-09-17 ENCOUNTER — Other Ambulatory Visit: Payer: Self-pay

## 2020-09-17 ENCOUNTER — Encounter: Payer: Self-pay | Admitting: Podiatry

## 2020-09-17 ENCOUNTER — Ambulatory Visit (INDEPENDENT_AMBULATORY_CARE_PROVIDER_SITE_OTHER): Payer: No Typology Code available for payment source | Admitting: Podiatry

## 2020-09-17 VITALS — BP 138/82 | HR 96 | Temp 97.7°F

## 2020-09-17 DIAGNOSIS — Z9889 Other specified postprocedural states: Secondary | ICD-10-CM

## 2020-09-17 DIAGNOSIS — S86312S Strain of muscle(s) and tendon(s) of peroneal muscle group at lower leg level, left leg, sequela: Secondary | ICD-10-CM

## 2020-09-17 NOTE — Progress Notes (Signed)
She presents today for her first postop visit 09/06/2020 peroneal tendon repair left with cast.  She states that this time was a lot harder than the last time with pain extending days into her recovery.  Objective: Vital signs are stable alert oriented x3.  There is no erythema edema cellulitis drainage or odor.  Toes are intact with good sensation and her cast is loose.  She denies any chest pain shortness of breath.  Assessment: Well-healing peroneal tendons left.  Plan: Follow-up with her in 1 week for cast removal

## 2020-09-24 ENCOUNTER — Other Ambulatory Visit: Payer: Self-pay

## 2020-09-24 ENCOUNTER — Encounter: Payer: Self-pay | Admitting: Podiatry

## 2020-09-24 ENCOUNTER — Ambulatory Visit (INDEPENDENT_AMBULATORY_CARE_PROVIDER_SITE_OTHER): Payer: No Typology Code available for payment source | Admitting: Podiatry

## 2020-09-24 DIAGNOSIS — S86312S Strain of muscle(s) and tendon(s) of peroneal muscle group at lower leg level, left leg, sequela: Secondary | ICD-10-CM

## 2020-09-24 DIAGNOSIS — Z9889 Other specified postprocedural states: Secondary | ICD-10-CM

## 2020-09-24 NOTE — Progress Notes (Signed)
She presents today for follow-up of her peroneal tendon repair left.  Date of surgery 09/06/2020 states that she still feels pain and soreness down that side.  She denies fever chills nausea vomiting muscle aches pains calf pain back pain chest pain shortness of breath.  Objective: Presents today nonweightbearing cast intact once removed demonstrates dressed her dressing intact was removed demonstrates minimal edema no erythema cellulitis drainage or odor staples and sutures are intact.  Assessment: Well-healing surgical foot leg.  Plan: At this point we redressed it today dressed a compressive dressing placed her back in another cast I will follow-up with her in 2 weeks she is to remain nonweightbearing.

## 2020-10-08 ENCOUNTER — Other Ambulatory Visit: Payer: Self-pay

## 2020-10-08 ENCOUNTER — Ambulatory Visit (INDEPENDENT_AMBULATORY_CARE_PROVIDER_SITE_OTHER): Payer: No Typology Code available for payment source | Admitting: Podiatry

## 2020-10-08 DIAGNOSIS — Z9889 Other specified postprocedural states: Secondary | ICD-10-CM

## 2020-10-08 DIAGNOSIS — S86312S Strain of muscle(s) and tendon(s) of peroneal muscle group at lower leg level, left leg, sequela: Secondary | ICD-10-CM | POA: Diagnosis not present

## 2020-10-09 ENCOUNTER — Encounter: Payer: Self-pay | Admitting: Podiatry

## 2020-10-09 NOTE — Progress Notes (Signed)
She presents today nearly 5 weeks status post peroneal tendon repair left.  She states that really feels pretty good not having a whole lot of issue with it.  Objective: Vital signs are stable alert and oriented x3.  Cast was removed staples were removed margins are well coapted there is minimal edema no erythema cellulitis drainage odor she has good range of motion no tenderness on range of motion.  She has good abduction against resistance with no pain.  Assessment well-healing surgical foot.  Plan: At this point I will place her in a cam boot but she is not to be weightbearing she is to continue nonweightbearing status she understands that and is amendable to it.  We will follow-up with me in 2 to 3 weeks at which time we hope to be able start bearing some weight on this.

## 2020-10-22 ENCOUNTER — Encounter: Payer: Self-pay | Admitting: Podiatry

## 2020-10-22 ENCOUNTER — Other Ambulatory Visit: Payer: Self-pay

## 2020-10-22 ENCOUNTER — Ambulatory Visit: Payer: No Typology Code available for payment source | Admitting: Podiatry

## 2020-10-22 DIAGNOSIS — Z9889 Other specified postprocedural states: Secondary | ICD-10-CM

## 2020-10-22 NOTE — Progress Notes (Signed)
She presents today for follow-up of her surgery date of surgery is 09/06/2020 peroneal tendon repair left.  She states is doing very well she been wearing a compression sleeve in her boot she is been nonweightbearing has not stood on it at all.  Denies any calf pain chest pain shortness of breath.  Objective: Vital signs are stable alert oriented x3 pulses are palpable.  Incision site is looking perfect there is no edema no erythema cellulitis drainage or odor she has good abduction against resistance plantarflexion and abduction is missing secondary to the tendon transfer.  Otherwise she is doing quite well.  There is no allodynic pain.  Assessment: Well-healing surgical foot and ankle.  Plan: I am going to follow-up with her in 3 weeks and would allow her to start taking short steps and standing static but most of the time she is going to use her knee scooter to help offload her operative foot.

## 2020-11-13 ENCOUNTER — Other Ambulatory Visit: Payer: Self-pay

## 2020-11-13 ENCOUNTER — Encounter: Payer: Self-pay | Admitting: Podiatry

## 2020-11-13 ENCOUNTER — Ambulatory Visit (INDEPENDENT_AMBULATORY_CARE_PROVIDER_SITE_OTHER): Payer: BC Managed Care – PPO | Admitting: Podiatry

## 2020-11-13 DIAGNOSIS — S86312S Strain of muscle(s) and tendon(s) of peroneal muscle group at lower leg level, left leg, sequela: Secondary | ICD-10-CM

## 2020-11-13 DIAGNOSIS — Z9889 Other specified postprocedural states: Secondary | ICD-10-CM

## 2020-11-13 NOTE — Progress Notes (Signed)
She presents today states that everything is just doing perfect states that is really not swollen and feels good just a Jaco pain in the very bottom of the heel but nothing bad.  She is status post 3 doing a peroneal tendon repair left.  Date of surgery 09/06/2020.  Objective: No erythema edema cellulitis drainage or odor scar is healing so much better than it did previously.  Very happy with the outcome at this point.  She has good eversion against resistance good abduction against resistance.  Assessment: Well-healing surgical foot.  Plan: I am at this point she is going to start weightbearing in her boot and in next 2 weeks were going to switch to a Tri-Lock brace into her tennis shoe.  It would probably be best to keep her out of work until the end of March and at the very least provided we do not have to do aggressive physical therapy.

## 2020-11-14 ENCOUNTER — Emergency Department
Admission: EM | Admit: 2020-11-14 | Discharge: 2020-11-15 | Disposition: A | Discharge status: Home / Self Care | Payer: BC Managed Care – PPO | Attending: Emergency Medicine | Admitting: Emergency Medicine

## 2020-11-14 ENCOUNTER — Other Ambulatory Visit: Payer: Self-pay

## 2020-11-14 ENCOUNTER — Emergency Department: Payer: BC Managed Care – PPO

## 2020-11-14 DIAGNOSIS — Z20822 Contact with and (suspected) exposure to covid-19: Secondary | ICD-10-CM | POA: Diagnosis not present

## 2020-11-14 DIAGNOSIS — R110 Nausea: Secondary | ICD-10-CM | POA: Insufficient documentation

## 2020-11-14 DIAGNOSIS — R0602 Shortness of breath: Secondary | ICD-10-CM | POA: Diagnosis not present

## 2020-11-14 DIAGNOSIS — M25512 Pain in left shoulder: Secondary | ICD-10-CM | POA: Diagnosis not present

## 2020-11-14 DIAGNOSIS — R0789 Other chest pain: Secondary | ICD-10-CM | POA: Insufficient documentation

## 2020-11-14 LAB — BASIC METABOLIC PANEL
Anion gap: 13 (ref 5–15)
BUN: 20 mg/dL (ref 6–20)
CO2: 27 mmol/L (ref 22–32)
Calcium: 9.8 mg/dL (ref 8.9–10.3)
Chloride: 102 mmol/L (ref 98–111)
Creatinine, Ser: 0.93 mg/dL (ref 0.44–1.00)
GFR, Estimated: >60 mL/min (ref >60)
Glucose, Bld: 100 mg/dL — ABNORMAL HIGH (ref 70–99)
Potassium: 3.9 mmol/L (ref 3.5–5.1)
Sodium: 142 mmol/L (ref 135–145)

## 2020-11-14 LAB — CBC
HCT: 40.6 % (ref 36.0–46.0)
Hemoglobin: 13.4 g/dL (ref 12.0–15.0)
MCH: 30.1 pg (ref 26.0–34.0)
MCHC: 33 g/dL (ref 30.0–36.0)
MCV: 91.2 fL (ref 80.0–100.0)
Platelets: 277 10*3/uL (ref 150–400)
RBC: 4.45 MIL/uL (ref 3.87–5.11)
RDW: 13.3 % (ref 11.5–15.5)
WBC: 8.7 10*3/uL (ref 4.0–10.5)
nRBC: 0 % (ref 0.0–0.2)

## 2020-11-14 LAB — TROPONIN I (HIGH SENSITIVITY): Troponin I (High Sensitivity): <2 ng/L (ref <18)

## 2020-11-14 MED ORDER — KETOROLAC TROMETHAMINE 30 MG/ML IJ SOLN
10.0000 mg | Freq: Once | INTRAMUSCULAR | Status: AC
  Administered 2020-11-14: 9.9 mg via INTRAVENOUS
  Filled 2020-11-14: qty 1

## 2020-11-14 MED ORDER — ONDANSETRON HCL 4 MG/2ML IJ SOLN
4.0000 mg | Freq: Once | INTRAMUSCULAR | Status: AC
  Administered 2020-11-15: 4 mg via INTRAVENOUS
  Filled 2020-11-14: qty 2

## 2020-11-14 NOTE — ED Notes (Addendum)
Pt lying awake in bed - GCS 15.  No acute distress noted. RR even and unlabored on RA with symmetrical rise and fall of chest.  O2 sats 100% on RA.  BP elevated at this time; 152/89 without known h/o HTN.  Abdomen soft nontender- no active nausea reported.  Denies any urinary or bowel complaints.  Skin warm dry and intact.  LLE cam boot worn at this time.  Will monitor for acute changes and maintain plan of care.

## 2020-11-14 NOTE — ED Provider Notes (Signed)
[facility] Emergency Department Provider Note   ____________________________________________   Event Date/Time   First MD Initiated Contact with Patient 11/14/20 2318     (approximate)  I have reviewed the triage vital signs and the nursing notes.   HISTORY  Chief Complaint Chest Pain and Shortness of Breath    HPI Janice Willis is a 50 y.o. female who presents to the ED from home with a chief complaint of chest pain.  Patient reports chest pain waxing/waning x4 days, constant since yesterday.  Associated with nausea especially while eating at [employer] and occasional shortness of breath.  Also reports pain beneath her left shoulder blade which is not exacerbated with movement.  Recent left foot tendon surgery November 2021.  Patient received her COVID-19 booster shot almost 1 week ago but has had Covid exposure since.  Denies fever, cough, abdominal pain, vomiting, dysuria or diarrhea.     Past medical history Celiac disease Migraines Anxiety  Patient Active Problem List   Diagnosis Date Noted  . Reduced libido 03/29/2017  . Symptomatic menopausal or female climacteric states 03/29/2017  . Celiac disease 04/19/2013  . Blepharitis 05/30/2009  . Migraine headache 02/20/2009  . Anxiety state 08/21/2008  . Eustachian tube dysfunction 08/21/2008  . 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 ABDOMINAL HYSTERECTOMY    . ANKLE SURGERY Left   . TONSILLECTOMY      Prior to Admission medications   Medication Sig Start Date End Date Taking? Authorizing Provider  naproxen (NAPROSYN) 500 MG tablet Take 1 tablet (500 mg total) by mouth 2 (two) times daily with a meal. 11/15/20  Yes Irean Hong, MD  clobetasol cream (TEMOVATE) 0.05 % Apply 1 application topically 2 (two) times daily. 02/27/20   Hyatt, Max T, DPM  cyclobenzaprine (FLEXERIL) 10 MG tablet Take one tablet once or twice daily for muscle  spasms. 01/02/20   Hyatt, Max T, DPM  doxycycline (VIBRA-TABS) 100 MG tablet Take 1 tablet (100 mg total) by mouth 2 (two) times daily. 09/04/20   Hyatt, Max T, DPM  fluticasone (FLONASE) 50 MCG/ACT nasal spray INSTILL 2 SPRAYS IN EACH NOSTRIL ONCE DAILY 01/31/20   [provider]  HYDROmorphone (DILAUDID) 4 MG tablet Take 1 tablet (4 mg total) by mouth every 4 (four) hours as needed. 09/04/20   Hyatt, Max T, DPM  meloxicam (MOBIC) 15 MG tablet Take 1 tablet (15 mg total) by mouth daily. 05/28/20   Hyatt, Max T, DPM  montelukast (SINGULAIR) 10 MG tablet TAKE ONE TABLET BY MOUTH EVERY NIGHT AT BEDTIME 01/31/20   [provider]  ondansetron (ZOFRAN) 4 MG tablet Take 1 tablet (4 mg total) by mouth every 8 (eight) hours as needed. 09/04/20   Hyatt, Max T, DPM    Allergies Codeine, Fluoxetine hcl, Methylprednisolone, Penicillins, Venlafaxine, Ciprofloxacin, Clindamycin/lincomycin, Other, Percocet [oxycodone-acetaminophen], Prednisone, Propoxyphene, and Sulfamethoxazole-trimethoprim  History reviewed. No pertinent family history.  Social History Social History   Tobacco Use  . Smoking status: Never Smoker  . Smokeless tobacco: Never Used  Substance Use Topics  . Alcohol use: Not Currently  . Drug use: Never    Review of Systems  Constitutional: No fever/chills Eyes: No visual changes. ENT: No sore throat. Cardiovascular: Positive for chest pain. Respiratory: Positive for shortness of breath. Gastrointestinal: No abdominal pain.  No nausea, no vomiting.  No diarrhea.  No constipation. Genitourinary: Negative for dysuria. Musculoskeletal: Negative for back pain. Skin:  Negative for rash. Neurological: Negative for headaches, focal weakness or numbness.   ____________________________________________   PHYSICAL EXAM:  VITAL SIGNS: ED Triage Vitals  Enc Vitals Group     BP 11/14/20 2200 (!) 162/100     Pulse Rate 11/14/20 2200 (!) 102     Resp 11/14/20 2200 18      Temp 11/14/20 2200 98.4 F (36.9 C)     Temp Source 11/14/20 2200 Oral     SpO2 11/14/20 2200 100 %     Weight 11/14/20 2156 187 lb (84.8 kg)     Height 11/14/20 2156 5\' 3"  (1.6 m)     Head Circumference --      Peak Flow --      Pain Score 11/14/20 2155 8     Pain Loc --      Pain Edu? --      Excl. in GC? --     Constitutional: Alert and oriented. Well appearing and in no acute distress. Eyes: Conjunctivae are normal. PERRL. EOMI. Head: Atraumatic. Nose: No congestion/rhinnorhea. Mouth/Throat: Mucous membranes are moist.   Neck: No stridor.   Cardiovascular: Normal rate, regular rhythm. Grossly normal heart sounds.  Good peripheral circulation. Respiratory: Normal respiratory effort.  No retractions. Lungs CTAB. Gastrointestinal: Soft and nontender to light or deep palpation. No distention. No abdominal bruits. No CVA tenderness. Musculoskeletal: Full range of movement to left shoulder without pain.  No lower extremity tenderness nor edema.  No joint effusions. Neurologic:  Normal speech and language. No gross focal neurologic deficits are appreciated. No gait instability. Skin:  Skin is warm, dry and intact. No rash noted. Psychiatric: Mood and affect are normal. Speech and behavior are normal.  ____________________________________________   LABS (all labs ordered are listed, but only abnormal results are displayed)  Labs Reviewed  BASIC METABOLIC PANEL - Abnormal; Notable for the following components:      Result Value   Glucose, Bld 100 (*)    All other components within normal limits  HEPATIC FUNCTION PANEL - Abnormal; Notable for the following components:   Total Protein 8.2 (*)    All other components within normal limits  SARS CORONAVIRUS 2 (TAT 6-24 HRS)  CBC  LIPASE, BLOOD  FIBRIN DERIVATIVES D-DIMER ([facility] ONLY)  TROPONIN I (HIGH SENSITIVITY)   ____________________________________________  EKG  ED ECG REPORT I, Sweetie Giebler J, the attending physician,  personally viewed and interpreted this ECG.   Date: 11/14/2020  EKG Time: 2156  Rate: 100  Rhythm: normal EKG, normal sinus rhythm  Axis: Normal  Intervals:none  ST&T Change: Nonspecific  ____________________________________________  RADIOLOGY I, Samoria Fedorko J, personally viewed and evaluated these images (plain radiographs) as part of my medical decision making, as well as reviewing the written report by the radiologist.  ED MD interpretation: No acute cardiopulmonary process  Official radiology report(s): DG Chest 2 View  Result Date: 11/14/2020 CLINICAL DATA:  Chest pain EXAM: CHEST - 2 VIEW COMPARISON:  None. FINDINGS: The heart size and mediastinal contours are within normal limits. Both lungs are clear. The visualized skeletal structures are unremarkable. IMPRESSION: No active cardiopulmonary disease. Electronically Signed   By: 11/16/2020 M.D.   On: 11/14/2020 22:16    ____________________________________________   PROCEDURES  Procedure(s) performed (including Critical Care):  Procedures   ____________________________________________   INITIAL IMPRESSION / ASSESSMENT AND PLAN / ED COURSE  As part of my medical decision making, I reviewed the following data within the electronic MEDICAL RECORD NUMBER Nursing notes reviewed and incorporated,  Labs reviewed, EKG interpreted, Old chart reviewed, Radiograph reviewed and Notes from prior ED visits     50 year old female presenting with a 4-day history of chest pain. Differential diagnosis includes, but is not limited to, ACS, aortic dissection, pulmonary embolism, cardiac tamponade, pneumothorax, pneumonia, pericarditis, myocarditis, GI-related causes including esophagitis/gastritis, and musculoskeletal chest wall pain.    Laboratory results unremarkable including negative troponin.  Do not feel repeat troponin warranted as pain has been ongoing for several days and would expect some elevation of troponin if patient is having a  cardiac event.  Would obtain LFTs/lipase, D-dimer, Covid swab.  IV Toradol and Zofran will be given.  Will reassess.  Clinical Course as of 11/15/20 9629  Caleen Essex Nov 15, 2020  0117 Patient feeling better after Toradol.  Updated her on all test results.  Strict return precautions given.  Patient verbalizes understanding and agrees with plan of care. [JS]    Clinical Course User Index [JS] Irean Hong, MD     ____________________________________________   FINAL CLINICAL IMPRESSION(S) / ED DIAGNOSES  Final diagnoses:  Atypical chest pain     ED Discharge Orders         Ordered    ibuprofen (ADVIL) 800 MG tablet  Every 8 hours PRN,   Status:  Discontinued        11/15/20 0116    HYDROcodone-acetaminophen (NORCO) 5-325 MG tablet  Every 6 hours PRN,   Status:  Discontinued        11/15/20 0116    traMADol (ULTRAM) 50 MG tablet  Every 6 hours PRN,   Status:  Discontinued        11/15/20 0129    ibuprofen (ADVIL) 800 MG tablet  Every 8 hours PRN,   Status:  Discontinued        11/15/20 0129    naproxen (NAPROSYN) 500 MG tablet  2 times daily with meals        11/15/20 0131          *Please note:  Susen Haskew was evaluated in Emergency Department on 11/15/2020 for the symptoms described in the history of present illness. She was evaluated in the context of the global COVID-19 pandemic, which necessitated consideration that the patient might be at risk for infection with the SARS-CoV-2 virus that causes COVID-19. Institutional protocols and algorithms that pertain to the evaluation of patients at risk for COVID-19 are in a state of rapid change based on information released by regulatory bodies including the CDC and federal and state organizations. These policies and algorithms were followed during the patient's care in the ED.  Some ED evaluations and interventions may be delayed as a result of limited staffing during and the pandemic.*   Note:  This document was prepared using Dragon  voice recognition software and may include unintentional dictation errors.   Irean Hong, MD 11/15/20 [phone]

## 2020-11-14 NOTE — ED Triage Notes (Addendum)
Yesterday pt reports feeling nauseous, and on the way here today. Pt reports L chest pain 8/10 and shob. Pt reports pain under L shoulder blade beginning of week. Denies coughing. No hx copd, asthma. Pt speaking in full sentences in triage w unlabored breathing.

## 2020-11-14 NOTE — ED Provider Notes (Incomplete)
[facility] Emergency Department Provider Note   ____________________________________________   Event Date/Time   First MD Initiated Contact with Patient 11/14/20 2318     (approximate)  I have reviewed the triage vital signs and the nursing notes.   HISTORY  Chief Complaint Chest Pain and Shortness of Breath    HPI Veleria Barnhardt is a 50 y.o. female who presents to the ED from home with a chief complaint of chest pain.  Patient reports chest pain waxing/waning x4 days, constant since yesterday.  Associated with nausea especially while eating at [employer] and occasional shortness of breath.  Also reports pain beneath her left shoulder blade which is not exacerbated with movement.  Recent left foot tendon surgery November 2021.  Patient received her COVID-19 booster shot almost 1 week ago but has had Covid exposure since.  Denies fever, cough, abdominal pain, vomiting, dysuria or diarrhea.     Past medical history Celiac disease Migraines Anxiety  Patient Active Problem List   Diagnosis Date Noted  . Reduced libido 03/29/2017  . Symptomatic menopausal or female climacteric states 03/29/2017  . Celiac disease 04/19/2013  . Blepharitis 05/30/2009  . Migraine headache 02/20/2009  . Anxiety state 08/21/2008  . Eustachian tube dysfunction 08/21/2008  . 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 ABDOMINAL HYSTERECTOMY    . ANKLE SURGERY Left   . TONSILLECTOMY      Prior to Admission medications   Medication Sig Start Date End Date Taking? Authorizing Provider  clobetasol cream (TEMOVATE) 0.05 % Apply 1 application topically 2 (two) times daily. 02/27/20   Hyatt, Max T, DPM  cyclobenzaprine (FLEXERIL) 10 MG tablet Take one tablet once or twice daily for muscle spasms. 01/02/20   Hyatt, Max T, DPM  doxycycline (VIBRA-TABS) 100 MG tablet Take 1 tablet (100 mg total) by mouth 2 (two) times daily.  09/04/20   Hyatt, Max T, DPM  fluticasone (FLONASE) 50 MCG/ACT nasal spray INSTILL 2 SPRAYS IN EACH NOSTRIL ONCE DAILY 01/31/20   [provider]  HYDROmorphone (DILAUDID) 4 MG tablet Take 1 tablet (4 mg total) by mouth every 4 (four) hours as needed. 09/04/20   Hyatt, Max T, DPM  ibuprofen (ADVIL) 800 MG tablet Take 1 tablet (800 mg total) by mouth 3 (three) times daily. 05/07/20   Hyatt, Max T, DPM  meloxicam (MOBIC) 15 MG tablet Take 1 tablet (15 mg total) by mouth daily. 05/28/20   Hyatt, Max T, DPM  montelukast (SINGULAIR) 10 MG tablet TAKE ONE TABLET BY MOUTH EVERY NIGHT AT BEDTIME 01/31/20   [provider]  ondansetron (ZOFRAN) 4 MG tablet Take 1 tablet (4 mg total) by mouth every 8 (eight) hours as needed. 09/04/20   Hyatt, Max T, DPM    Allergies Codeine, Fluoxetine hcl, Methylprednisolone, Penicillins, Venlafaxine, Ciprofloxacin, Clindamycin/lincomycin, Other, Percocet [oxycodone-acetaminophen], Prednisone, Propoxyphene, and Sulfamethoxazole-trimethoprim  History reviewed. No pertinent family history.  Social History Social History   Tobacco Use  . Smoking status: Never Smoker  . Smokeless tobacco: Never Used  Substance Use Topics  . Alcohol use: Not Currently  . Drug use: Never    Review of Systems  Constitutional: No fever/chills Eyes: No visual changes. ENT: No sore throat. Cardiovascular: Positive for chest pain. Respiratory: Positive for shortness of breath. Gastrointestinal: No abdominal pain.  No nausea, no vomiting.  No diarrhea.  No constipation. Genitourinary: Negative for dysuria. Musculoskeletal: Negative for back pain. Skin: Negative for rash.  Neurological: Negative for headaches, focal weakness or numbness.   ____________________________________________   PHYSICAL EXAM:  VITAL SIGNS: ED Triage Vitals  Enc Vitals Group     BP 11/14/20 2200 (!) 162/100     Pulse Rate 11/14/20 2200 (!) 102     Resp 11/14/20 2200 18     Temp  11/14/20 2200 98.4 F (36.9 C)     Temp Source 11/14/20 2200 Oral     SpO2 11/14/20 2200 100 %     Weight 11/14/20 2156 187 lb (84.8 kg)     Height 11/14/20 2156 5\' 3"  (1.6 m)     Head Circumference --      Peak Flow --      Pain Score 11/14/20 2155 8     Pain Loc --      Pain Edu? --      Excl. in GC? --     Constitutional: Alert and oriented. Well appearing and in no acute distress. Eyes: Conjunctivae are normal. PERRL. EOMI. Head: Atraumatic. Nose: No congestion/rhinnorhea. Mouth/Throat: Mucous membranes are moist.   Neck: No stridor.   Cardiovascular: Normal rate, regular rhythm. Grossly normal heart sounds.  Good peripheral circulation. Respiratory: Normal respiratory effort.  No retractions. Lungs CTAB. Gastrointestinal: Soft and nontender to light or deep palpation. No distention. No abdominal bruits. No CVA tenderness. Musculoskeletal: Full range of movement to left shoulder without pain.  No lower extremity tenderness nor edema.  No joint effusions. Neurologic:  Normal speech and language. No gross focal neurologic deficits are appreciated. No gait instability. Skin:  Skin is warm, dry and intact. No rash noted. Psychiatric: Mood and affect are normal. Speech and behavior are normal.  ____________________________________________   LABS (all labs ordered are listed, but only abnormal results are displayed)  Labs Reviewed  BASIC METABOLIC PANEL - Abnormal; Notable for the following components:      Result Value   Glucose, Bld 100 (*)    All other components within normal limits  CBC  HEPATIC FUNCTION PANEL  LIPASE, BLOOD  FIBRIN DERIVATIVES D-DIMER ([facility] ONLY)  POC URINE PREG, ED  TROPONIN I (HIGH SENSITIVITY)  TROPONIN I (HIGH SENSITIVITY)   ____________________________________________  EKG  ED ECG REPORT I, SUNG,JADE J, the attending physician, personally viewed and interpreted this ECG.   Date: 11/14/2020  EKG Time: 2156  Rate: 100  Rhythm: normal  EKG, normal sinus rhythm  Axis: Normal  Intervals:none  ST&T Change: Nonspecific  ____________________________________________  RADIOLOGY I, SUNG,JADE J, personally viewed and evaluated these images (plain radiographs) as part of my medical decision making, as well as reviewing the written report by the radiologist.  ED MD interpretation: No acute cardiopulmonary process  Official radiology report(s): DG Chest 2 View  Result Date: 11/14/2020 CLINICAL DATA:  Chest pain EXAM: CHEST - 2 VIEW COMPARISON:  None. FINDINGS: The heart size and mediastinal contours are within normal limits. Both lungs are clear. The visualized skeletal structures are unremarkable. IMPRESSION: No active cardiopulmonary disease. Electronically Signed   By: 11/16/2020 M.D.   On: 11/14/2020 22:16    ____________________________________________   PROCEDURES  Procedure(s) performed (including Critical Care):  Procedures   ____________________________________________   INITIAL IMPRESSION / ASSESSMENT AND PLAN / ED COURSE  As part of my medical decision making, I reviewed the following data within the electronic MEDICAL RECORD NUMBER Nursing notes reviewed and incorporated, Labs reviewed, EKG interpreted, Old chart reviewed, Radiograph reviewed and Notes from prior ED visits     50 year old female presenting  with a 4-day history of chest pain. Differential diagnosis includes, but is not limited to, ACS, aortic dissection, pulmonary embolism, cardiac tamponade, pneumothorax, pneumonia, pericarditis, myocarditis, GI-related causes including esophagitis/gastritis, and musculoskeletal chest wall pain.    Laboratory results unremarkable including negative troponin.  Do not feel repeat troponin warranted as pain has been ongoing for several days and would expect some elevation of troponin if patient is having a cardiac event.  Would obtain LFTs/lipase, D-dimer, Covid swab.  IV Toradol and Zofran will be given.  Will  reassess.      ____________________________________________   FINAL CLINICAL IMPRESSION(S) / ED DIAGNOSES  Final diagnoses:  Atypical chest pain     ED Discharge Orders    None      *Please note:  Allien Melberg was evaluated in Emergency Department on 11/14/2020 for the symptoms described in the history of present illness. She was evaluated in the context of the global COVID-19 pandemic, which necessitated consideration that the patient might be at risk for infection with the SARS-CoV-2 virus that causes COVID-19. Institutional protocols and algorithms that pertain to the evaluation of patients at risk for COVID-19 are in a state of rapid change based on information released by regulatory bodies including the CDC and federal and state organizations. These policies and algorithms were followed during the patient's care in the ED.  Some ED evaluations and interventions may be delayed as a result of limited staffing during and the pandemic.*   Note:  This document was prepared using Dragon voice recognition software and may include unintentional dictation errors.

## 2020-11-15 LAB — HEPATIC FUNCTION PANEL
ALT: 15 U/L (ref 0–44)
AST: 17 U/L (ref 15–41)
Albumin: 4.7 g/dL (ref 3.5–5.0)
Alkaline Phosphatase: 58 U/L (ref 38–126)
Bilirubin, Direct: <0.1 mg/dL (ref 0.0–0.2)
Total Bilirubin: 0.5 mg/dL (ref 0.3–1.2)
Total Protein: 8.2 g/dL — ABNORMAL HIGH (ref 6.5–8.1)

## 2020-11-15 LAB — FIBRIN DERIVATIVES D-DIMER (ARMC ONLY): Fibrin derivatives D-dimer (ARMC): 266.97 ng/mL (FEU) (ref 0.00–499.00)

## 2020-11-15 LAB — LIPASE, BLOOD: Lipase: 38 U/L (ref 11–51)

## 2020-11-15 LAB — SARS CORONAVIRUS 2 (TAT 6-24 HRS): SARS Coronavirus 2: NEGATIVE

## 2020-11-15 MED ORDER — IBUPROFEN 800 MG PO TABS: 800.0000 mg | ORAL_TABLET | Freq: Three times a day (TID) | ORAL | 0 refills | Status: DC | PRN

## 2020-11-15 MED ORDER — TRAMADOL HCL 50 MG PO TABS: 50.0000 mg | ORAL_TABLET | Freq: Four times a day (QID) | ORAL | 0 refills | Status: DC | PRN

## 2020-11-15 MED ORDER — HYDROCODONE-ACETAMINOPHEN 5-325 MG PO TABS: 1.0000 | ORAL_TABLET | Freq: Four times a day (QID) | ORAL | 0 refills | Status: DC | PRN

## 2020-11-15 MED ORDER — NAPROXEN 500 MG PO TABS: 500.0000 mg | ORAL_TABLET | Freq: Two times a day (BID) | ORAL | 0 refills | Status: DC

## 2020-11-15 NOTE — ED Notes (Signed)
Pt agreeable with d/c plan as discussed by provider- this nurse has verbally reinforced d/c instructions and provided pt with written copy - pt acknowledges verbal understanding and denies any additional questions concerns needs.  Assisted to exit via w/c -- husband at exit to drive patient home.

## 2020-11-15 NOTE — Discharge Instructions (Addendum)
1.  You may take pain medicine as needed (Naprosyn #14). 2.  Apply moist heat to affected area several times daily. 3.  Return to the ER for worsening symptoms, persistent vomiting, difficulty breathing or other concerns.

## 2020-12-02 ENCOUNTER — Encounter: Payer: Self-pay | Admitting: Podiatry

## 2020-12-02 ENCOUNTER — Ambulatory Visit (INDEPENDENT_AMBULATORY_CARE_PROVIDER_SITE_OTHER): Payer: BC Managed Care – PPO | Admitting: Podiatry

## 2020-12-02 ENCOUNTER — Other Ambulatory Visit: Payer: Self-pay

## 2020-12-02 DIAGNOSIS — Z9889 Other specified postprocedural states: Secondary | ICD-10-CM

## 2020-12-02 DIAGNOSIS — S86312S Strain of muscle(s) and tendon(s) of peroneal muscle group at lower leg level, left leg, sequela: Secondary | ICD-10-CM

## 2020-12-02 NOTE — Progress Notes (Signed)
She presents today for follow-up of her peroneal tendon repair left.  States that she is doing quite well states that is swelling a [place] more it feels tight but all in all she is doing quite well date of surgery September 06, 2020.  Objective: Vital signs are stable alert oriented x3.  There is minimal edema no erythema cellulitis drainage or odor no reproducible pain.  Continues to wear silicone scar shield over the scar.  Assessment: Well-healing surgical ankle.  Plan: At this point I am going to recommend that she get into a Tri-Lock brace and I will follow-up with her in about a month.

## 2020-12-25 ENCOUNTER — Other Ambulatory Visit: Payer: Self-pay

## 2020-12-25 ENCOUNTER — Ambulatory Visit (INDEPENDENT_AMBULATORY_CARE_PROVIDER_SITE_OTHER): Payer: BC Managed Care – PPO | Admitting: Podiatry

## 2020-12-25 DIAGNOSIS — S86312S Strain of muscle(s) and tendon(s) of peroneal muscle group at lower leg level, left leg, sequela: Secondary | ICD-10-CM

## 2020-12-25 DIAGNOSIS — Z9889 Other specified postprocedural states: Secondary | ICD-10-CM | POA: Diagnosis not present

## 2020-12-25 NOTE — Progress Notes (Signed)
She presents today for follow-up of her ankle surgery left.  She states that she is doing quite well just feels that the ankle itself is weak.  She is concerned about falling and has not not holding her up.  Does not want to depend on the brace forever.  Objective: Vital signs are stable alert oriented x3.  No reproducible pain on palpation of the left foot and ankle.  At this point time she still has some tenderness on palpation of the incision site itself though it is gone to heal uneventfully she does have some weakness on evaluation.  Assessment: Well-healing surgical foot left.  I am going to schedule her with own physical therapy at [facility] so that she can strength in that leg.  Plan: Schedule with physical therapy for muscle strengthening left lower extremity.  Follow-up with me in 1 month

## 2021-01-08 ENCOUNTER — Ambulatory Visit (HOSPITAL_COMMUNITY): Payer: BC Managed Care – PPO | Attending: Podiatry | Admitting: Physical Therapy

## 2021-01-08 ENCOUNTER — Encounter (HOSPITAL_COMMUNITY): Payer: Self-pay | Admitting: Physical Therapy

## 2021-01-08 ENCOUNTER — Other Ambulatory Visit: Payer: Self-pay

## 2021-01-08 DIAGNOSIS — M25572 Pain in left ankle and joints of left foot: Secondary | ICD-10-CM | POA: Diagnosis not present

## 2021-01-08 DIAGNOSIS — R2689 Other abnormalities of gait and mobility: Secondary | ICD-10-CM | POA: Diagnosis present

## 2021-01-08 NOTE — Therapy (Signed)
[facility] [address] [place], [place], [address] Phone: [phone]   Fax:  [phone]  Physical Therapy Evaluation  Patient Details  Name: Janice Willis MRN: [id number] Date of Birth: July 06, 1971 Referring Provider (PT): Ernestene Kiel MD   Encounter Date: 01/08/2021   PT End of Session - 01/08/21 1716    Visit Number 1    Number of Visits 8    Date for PT Re-Evaluation 02/05/21    Authorization Type [employer] COMM PPO (30 VL)    Authorization - Visit Number 1    Authorization - Number of Visits 30    PT Start Time 1605    PT Stop Time 1705    PT Time Calculation (min) 60 min    Activity Tolerance Patient tolerated treatment well    Behavior During Therapy [facility] for tasks assessed/performed           History reviewed. No pertinent past medical history.  Past Surgical History:  Procedure Laterality Date  . ABDOMINAL HYSTERECTOMY    . ANKLE SURGERY Left   . TONSILLECTOMY      There were no vitals filed for this visit.    Subjective Assessment - 01/08/21 1619    Subjective Patient presents to physical therapy with complaint of LT foot pain s/p LT peroneal tendon repair 09/06/20. She says ankle still feels weak and she is having trouble with balance, walking and stability. She says pain is not bad, but has increased pain every now and then. Has some occasional muscle cramps. This has happened more since weaning from stability brace. Has also noticed some swelling with this. She has spent the last 2 days out of the brace. She is currently walking without AD. Has been ambulating without since February.    Pertinent History LT peroneal tendon repair 09/06/20    Limitations House hold activities;Lifting;Standing;Walking    How long can you stand comfortably? up to 2 hours    How long can you walk comfortably? 45-60 min    Patient Stated Goals Get back to normal, be able to walk normal, stand for 8 hours, increase walking speed    Currently  in Pain? No/denies              [facility] PT Assessment - 01/08/21 0001      Assessment   Medical Diagnosis Lt peroneal tendon repair    Referring Provider (PT) Ernestene Kiel MD    Onset Date/Surgical Date 09/06/20    Prior Therapy Yes      Precautions   Precautions None      Restrictions   Weight Bearing Restrictions No      Balance Screen   Has the patient fallen in the past 6 months No      Home Environment   Living Environment Private residence      Prior Function   Level of Independence Independent      Cognition   Overall Cognitive Status Within Functional Limits for tasks assessed      Observation/Other Assessments-Edema    Edema --   min edema diffuse about Lt ankle     ROM / Strength   AROM / PROM / Strength AROM;Strength      AROM   AROM Assessment Site Ankle    Right/Left Ankle Right;Left    Right Ankle Dorsiflexion 15    Right Ankle Plantar Flexion 50    Right Ankle Inversion 25    Right Ankle Eversion 15    Left  Ankle Dorsiflexion 5    Left Ankle Plantar Flexion 30    Left Ankle Inversion 15    Left Ankle Eversion 0      Strength   Strength Assessment Site Ankle    Right/Left Ankle Right;Left    Right Ankle Dorsiflexion 5/5    Right Ankle Plantar Flexion 5/5    Right Ankle Inversion 5/5    Right Ankle Eversion 5/5    Left Ankle Dorsiflexion 5/5    Left Ankle Plantar Flexion 4/5    Left Ankle Inversion 4+/5    Left Ankle Eversion 4/5      Flexibility   Soft Tissue Assessment /Muscle Length --   Mod restriction in LT calf flexibility     Palpation   Palpation comment Min/mod TTP about LT lateral malleolus about incision      Ambulation/Gait   Ambulation/Gait Yes    Ambulation/Gait Assistance 6: Modified independent (Device/Increase time)    Assistive device None    Gait Pattern Decreased stride length;Decreased stance time - left;Decreased step length - right;Decreased dorsiflexion - left;Antalgic    Ambulation Surface Level;Indoor       Balance   Balance Assessed Yes      Static Standing Balance   Static Standing Balance -  Activities  Single Leg Stance - Right Leg;Single Leg Stance - Left Leg    Static Standing - Comment/# of Minutes 9 sec, 3 sec mod sway                      Objective measurements completed on examination: See above findings.       [facility] Adult PT Treatment/Exercise - 01/08/21 0001      Exercises   Exercises Ankle      Ankle Exercises: Stretches   Gastroc Stretch 3 reps;30 seconds      Ankle Exercises: Seated   ABC's 1 rep    Ankle Circles/Pumps 20 reps    Other Seated Ankle Exercises ankle band 4 way GTB x20 each                  PT Education - 01/08/21 1628    Education Details on evaluation findings, POC and HEP    Person(s) Educated Patient    Methods Explanation;Handout    Comprehension Verbalized understanding            PT Short Term Goals - 01/08/21 1727      PT SHORT TERM GOAL #1   Title Patient will be independent with initial HEP and self-management strategies to improve functional outcomes    Time 2    Period Weeks    Status New    Target Date 01/22/21             PT Long Term Goals - 01/08/21 1727      PT LONG TERM GOAL #1   Title Patient will report at least 80% overall improvement in subjective complaint to indicate improvement in ability to perform ADLs.    Time 4    Period Weeks    Status New    Target Date 02/05/21      PT LONG TERM GOAL #2   Title Patient will be able to maintain single limb stance  >15 seconds on BLEs to improve stability and ankle control    Time 4    Period Weeks    Status New    Target Date 02/05/21      PT LONG TERM GOAL #3  Title Patient will increase LT ankle DF to at least 10 degrees for improved ambulation and functional mobility    Time 4    Period Weeks    Status New    Target Date 02/05/21      PT LONG TERM GOAL #4   Title Patient will report being able to stand at least 4 hours with no  increase in ankle pain to transition to return to work.    Time 4    Period Weeks    Status New    Target Date 02/05/21                  Plan - 01/08/21 1717    Clinical Impression Statement Patient is a 50 y.o. female who presents to physical therapy with complaint of LT ankle pain s/p LT peroneal tendon repair. Patient demonstrates decreased strength, ROM restriction, decreased balance, gait abnormalities and flexibility restrictions which are likely contributing to symptoms of pain and are negatively impacting patient ability to perform ADLs and functional mobility tasks. Patient will benefit from skilled physical therapy services to address these deficits to reduce pain and improve level of function with ADLs and functional mobility tasks.    Examination-Activity Limitations Locomotion Level;Transfers;Stand;Stairs    Examination-Participation Restrictions Occupation;Laundry;Yard Work;Community Activity;Cleaning    Stability/Clinical Decision Making Stable/Uncomplicated    Clinical Decision Making Low    Rehab Potential Good    PT Frequency 2x / week    PT Duration 4 weeks    PT Treatment/Interventions ADLs/Self Care Home Management;Aquatic Therapy;Biofeedback;Cryotherapy;Stair training;Functional mobility training;Electrical Stimulation;Therapeutic activities;Manual techniques;Therapeutic exercise;Iontophoresis 4mg /ml Dexamethasone;Moist Heat;Traction;Balance training;Manual lymph drainage;Vestibular;Vasopneumatic Device;Taping;Splinting;Energy conservation;Orthotic Fit/Training;Dry needling;Joint Manipulations;Spinal Manipulations;Passive range of motion;Scar mobilization;Visual/perceptual remediation/compensation;Compression bandaging;Neuromuscular re-education;Fluidtherapy;Patient/family education;Contrast Bath;Parrafin;Ultrasound;DME Instruction;Gait training    PT Next Visit Plan Review goals and HEP. Manual for ankle mobility and scar tissue mobilization. Progress ankle mobility  and satbilization as able. Add standing calf stretch, BAPS, rocker board, step ups    PT Home Exercise Plan Eval: ABCs, ankle band 4 way, seated calf stretch    Consulted and Agree with Plan of Care Patient           Patient will benefit from skilled therapeutic intervention in order to improve the following deficits and impairments:  Abnormal gait,Decreased range of motion,Decreased balance,Decreased mobility,Difficulty walking,Impaired flexibility,Decreased strength,Pain,Increased fascial restricitons,Decreased activity tolerance,Increased edema,Decreased scar mobility  Visit Diagnosis: Pain in left ankle and joints of left foot  Other abnormalities of gait and mobility     Problem List Patient Active Problem List   Diagnosis Date Noted  . Reduced libido 03/29/2017  . Symptomatic menopausal or female climacteric states 03/29/2017  . Celiac disease 04/19/2013  . Blepharitis 05/30/2009  . Migraine headache 02/20/2009  . Anxiety state 08/21/2008  . Eustachian tube dysfunction 08/21/2008  . 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

## 2021-01-08 NOTE — Patient Instructions (Signed)
Access Code: [email or web address] URL: [email or web address] Date: 01/08/2021 Prepared by: Georges Lynch  Exercises Ankle Dorsiflexion with Resistance - 3 x daily - 7 x weekly - 2 sets - 10 reps Ankle Eversion with Resistance - 3 x daily - 7 x weekly - 2 sets - 10 reps Ankle Inversion with Resistance - 3 x daily - 7 x weekly - 2 sets - 10 reps Long Sitting Ankle Plantar Flexion with Resistance - 3 x daily - 7 x weekly - 2 sets - 10 reps Seated Ankle Alphabet - 3 x daily - 7 x weekly - 1 sets - 1 reps Seated Calf Stretch with Strap - 3 x daily - 7 x weekly - 1 sets - 3 reps - 30 seconds hold

## 2021-01-16 ENCOUNTER — Ambulatory Visit (HOSPITAL_COMMUNITY): Payer: BC Managed Care – PPO | Admitting: Physical Therapy

## 2021-01-16 ENCOUNTER — Telehealth (HOSPITAL_COMMUNITY): Payer: Self-pay | Admitting: Physical Therapy

## 2021-01-16 NOTE — Telephone Encounter (Signed)
Pt has a fever and will not be here today

## 2021-01-21 ENCOUNTER — Ambulatory Visit (HOSPITAL_COMMUNITY): Payer: BC Managed Care – PPO | Admitting: Physical Therapy

## 2021-01-23 ENCOUNTER — Encounter (HOSPITAL_COMMUNITY): Payer: BC Managed Care – PPO | Admitting: Physical Therapy

## 2021-01-24 ENCOUNTER — Encounter: Payer: Self-pay | Admitting: Podiatry

## 2021-01-27 ENCOUNTER — Encounter: Payer: BC Managed Care – PPO | Admitting: Podiatry

## 2021-01-27 ENCOUNTER — Ambulatory Visit (HOSPITAL_COMMUNITY): Payer: BC Managed Care – PPO | Admitting: Physical Therapy

## 2021-01-30 ENCOUNTER — Encounter (HOSPITAL_COMMUNITY): Payer: BC Managed Care – PPO | Admitting: Physical Therapy

## 2021-02-04 ENCOUNTER — Encounter (HOSPITAL_COMMUNITY): Payer: BC Managed Care – PPO | Admitting: Physical Therapy

## 2021-02-06 ENCOUNTER — Ambulatory Visit (INDEPENDENT_AMBULATORY_CARE_PROVIDER_SITE_OTHER): Payer: BC Managed Care – PPO | Admitting: Podiatry

## 2021-02-06 ENCOUNTER — Encounter (HOSPITAL_COMMUNITY): Payer: BC Managed Care – PPO | Admitting: Physical Therapy

## 2021-02-06 ENCOUNTER — Encounter: Payer: Self-pay | Admitting: Podiatry

## 2021-02-06 ENCOUNTER — Other Ambulatory Visit: Payer: Self-pay

## 2021-02-06 DIAGNOSIS — S86312S Strain of muscle(s) and tendon(s) of peroneal muscle group at lower leg level, left leg, sequela: Secondary | ICD-10-CM

## 2021-02-06 DIAGNOSIS — Z9889 Other specified postprocedural states: Secondary | ICD-10-CM

## 2021-02-09 NOTE — Progress Notes (Signed)
She presents today date of surgery September 06, 2020 states that she is back to work part-time she states is really doing well it swells but it seems to be doing better.  She has minor pains occasionally as she refers to the repair of her peroneal tendon left foot.  Objective: Vital signs are stable alert oriented x3 there is no erythema edema cellulitis drainage or odor she is got great range of motion no pain on abduction against resistance.  Still has some numbness and tingling to the lateral aspect of the foot.  Assessment: Well-healing surgical foot and ankle left.  Plan: Follow-up with her in a few weeks.

## 2021-02-27 IMAGING — MR MR ANKLE*L* W/O CM
5 series · 40 of 40 positions shown · non-contrast
Comparison: MRI left ankle 11/10/2019.

CLINICAL DATA: Patient status post repair of the anterior
talofibular ligament and peroneal tendon of the left ankle
12/29/2019. Continued left ankle pain.

EXAM:
MRI OF THE LEFT ANKLE WITHOUT CONTRAST
TECHNIQUE: Multiplanar, multisequence MR imaging of the ankle was performed. No
intravenous contrast was administered.

[Series 4: T2 fat-sat · axial · 3.0mm · 0.53mm/px · z∈[-132,+7]mm · 8 of 36 slices shown (1 of 2)]
[im 1/36]
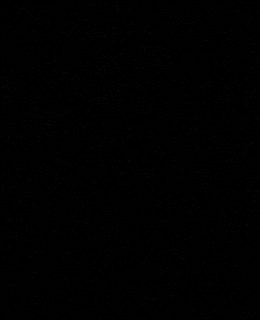
[im 6/36]
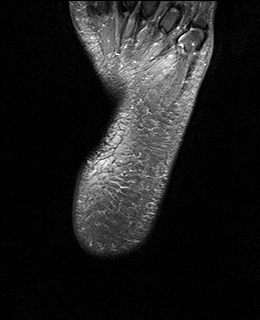
[im 11/36]
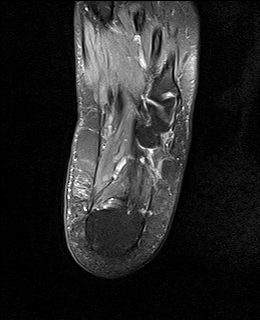
[im 16/36]
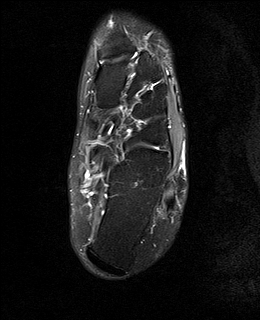
[im 21/36]
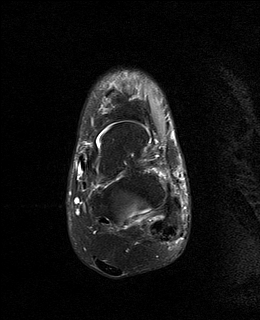
[im 26/36]
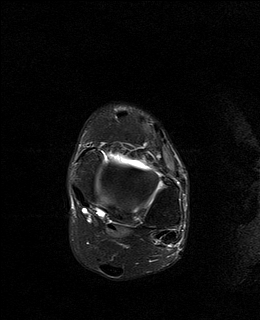
[im 31/36]
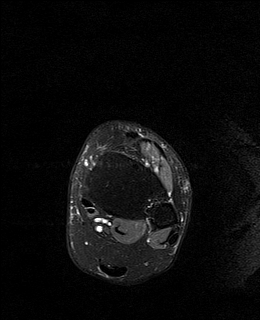
[im 36/36]
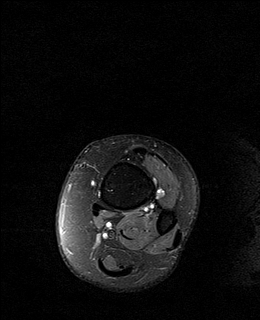

[Series 5: PD fat-sat · axial · 3.0mm · 0.47mm/px · z∈[-133,+7]mm · 9 of 36 slices shown]
[im 1/36]
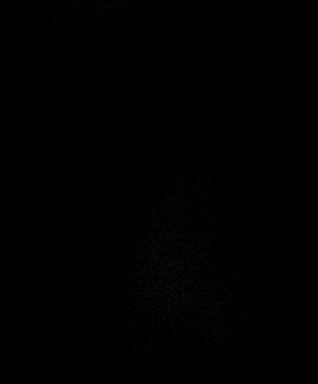
[im 5/36]
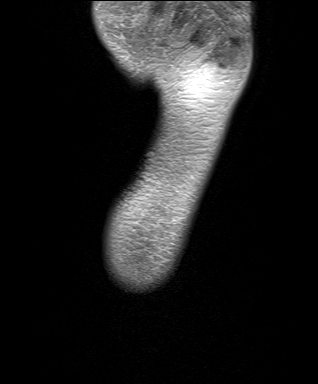
[im 9/36]
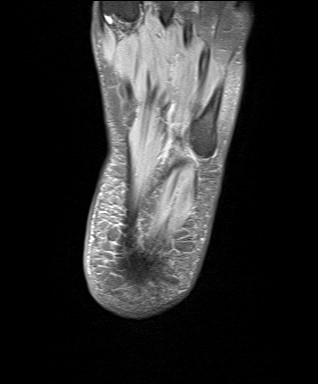
[im 14/36]
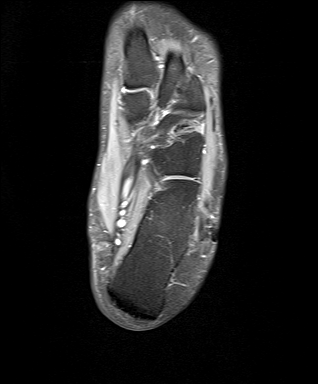
[im 18/36]
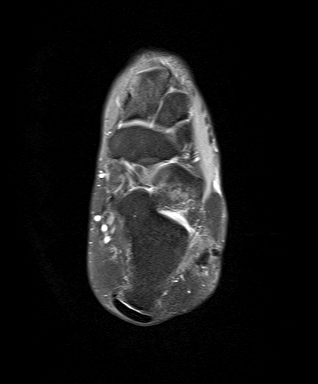
[im 22/36]
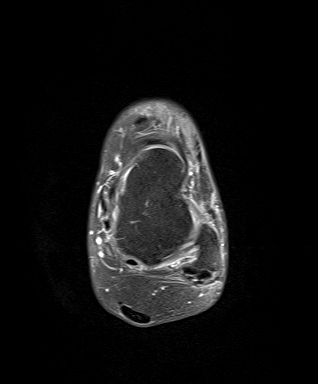
[im 27/36]
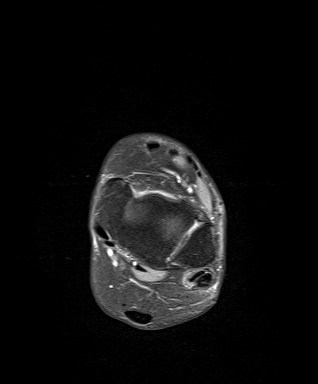
[im 31/36]
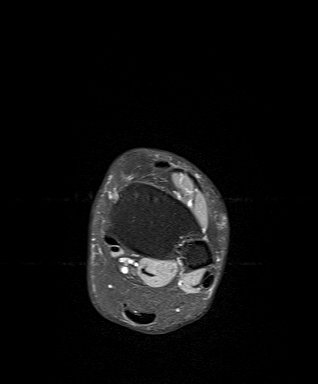
[im 36/36]
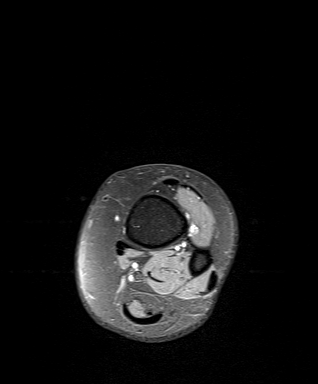

[Series 6: T1 · sagittal · 4.0mm · 0.56mm/px · 6 of 22 slices shown]
[im 1/22]
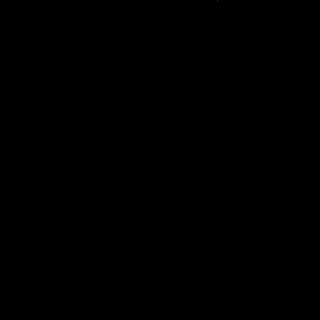
[im 5/22]
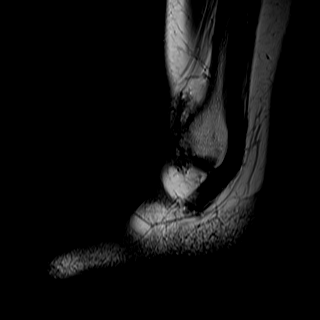
[im 9/22]
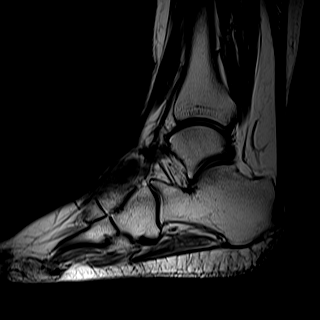
[im 13/22]
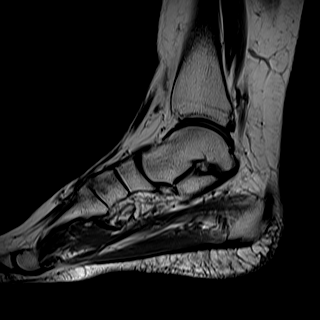
[im 17/22]
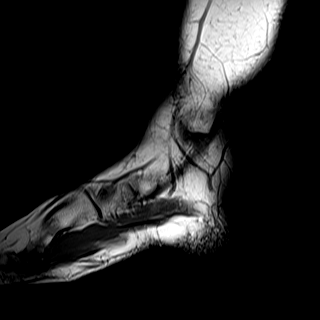
[im 22/22]
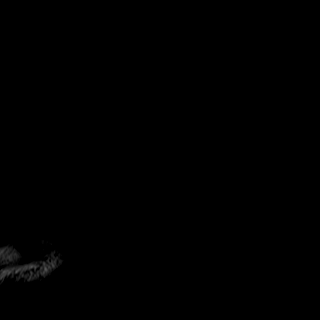

[Series 7: STIR · sagittal · 4.0mm · 0.70mm/px · 6 of 22 slices shown]
[im 1/22]
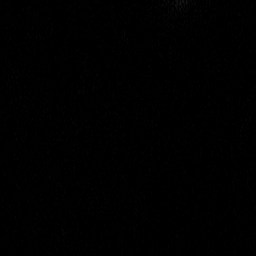
[im 5/22]
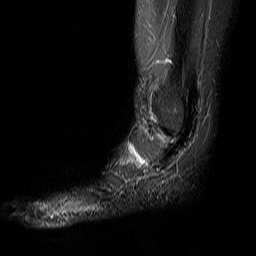
[im 9/22]
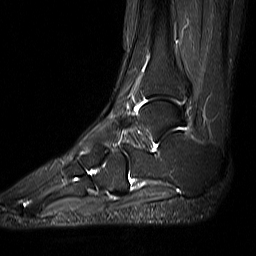
[im 13/22]
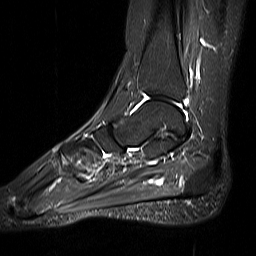
[im 17/22]
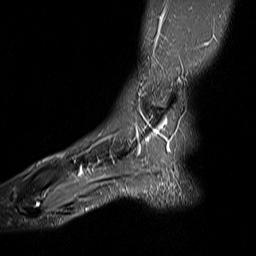
[im 22/22]
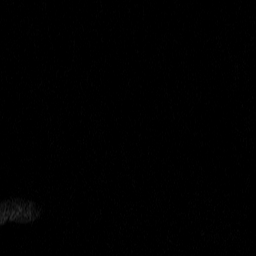

[Series 8: T2 fat-sat · coronal · 3.0mm · 0.62mm/px · 11 of 42 slices shown (2 of 2)]
[im 1/42]
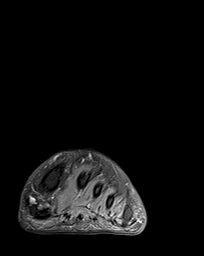
[im 5/42]
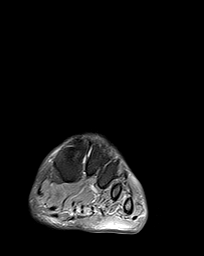
[im 9/42]
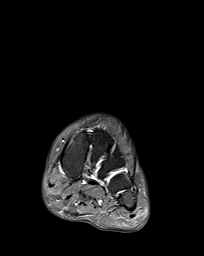
[im 13/42]
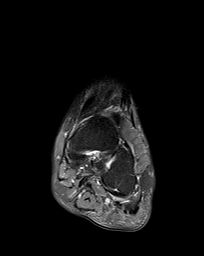
[im 17/42]
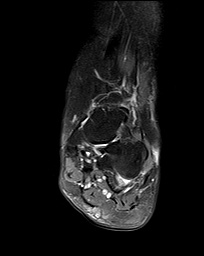
[im 21/42]
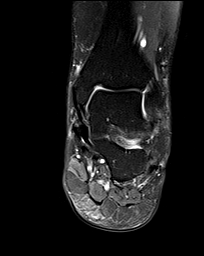
[im 25/42]
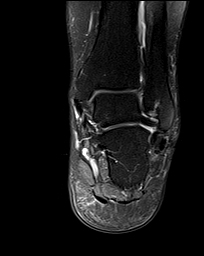
[im 29/42]
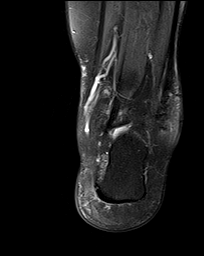
[im 33/42]
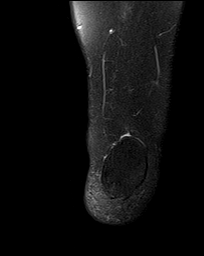
[im 37/42]
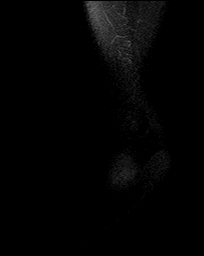
[im 42/42]
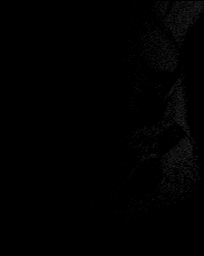

[40 of 40 positions shown; findings below may reference images not displayed]

FINDINGS: TENDONS

Peroneal: The patient is status post peroneal tendon surgery. The
appearance is most compatible with tubulation procedure. There is
new intermediate increased T2 signal and attenuation of the peroneus
longus as it passes beneath the calcaneus worrisome for tendinosis
and partial tear.

Posteromedial: Intact.

Anterior: Intact.

Achilles: Intact.

Plantar Fascia: Intact with normal signal.

LIGAMENTS

Lateral: The anterior talofibular ligament appears thickened
compatible with prior surgery. Lateral ligaments are intact.

Medial: Intact.

CARTILAGE

Ankle Joint: Normal. No osteochondral lesion of the talar dome or
fluid collection.

Subtalar Joints/Sinus Tarsi: Normal.

Bones: Normal marrow signal throughout.

Other: None.
IMPRESSION: Status post peroneal tendon repair. The repair appears intact but
the peroneus longus is attenuated with intermediate increased T2
signal as it passes beneath the calcaneus suggestive of tendinosis
and partial tear. The appearance is worse than on the preoperative
exam.

Status post ATFL repair.  The ligament appears intact.

## 2021-03-12 ENCOUNTER — Encounter: Payer: Self-pay | Admitting: Podiatry

## 2021-03-12 ENCOUNTER — Other Ambulatory Visit: Payer: Self-pay

## 2021-03-12 ENCOUNTER — Ambulatory Visit (INDEPENDENT_AMBULATORY_CARE_PROVIDER_SITE_OTHER): Payer: BC Managed Care – PPO | Admitting: Podiatry

## 2021-03-12 DIAGNOSIS — Z9889 Other specified postprocedural states: Secondary | ICD-10-CM

## 2021-03-12 DIAGNOSIS — S86312S Strain of muscle(s) and tendon(s) of peroneal muscle group at lower leg level, left leg, sequela: Secondary | ICD-10-CM | POA: Diagnosis not present

## 2021-03-12 DIAGNOSIS — M79672 Pain in left foot: Secondary | ICD-10-CM | POA: Diagnosis not present

## 2021-03-12 NOTE — Progress Notes (Signed)
She presents today for postop visit date of surgery was 09/06/2020 for peroneal tendon repair she states that she is doing very good she states still swells sometimes gets a [place] tender but most part is doing very well.  Objective: Vital signs are stable she is alert oriented x3 no edema no erythema cellulitis drainage or odor scar is nontender that she still has some numbness associated with it.  She has good range of motion and a good stable ankle with anterior ankle repair as well as the peroneal tendon repair.  Assessment: Well-healing surgical foot.  Plan: Allow her to get back to her new job standing 8 hours a day I will follow-up with her on an as-needed basis.

## 2021-03-20 ENCOUNTER — Encounter: Payer: BC Managed Care – PPO | Admitting: Podiatry

## 2021-07-17 ENCOUNTER — Encounter: Payer: Self-pay | Admitting: Psychiatry

## 2021-07-17 ENCOUNTER — Ambulatory Visit: Payer: 59 | Admitting: Psychiatry

## 2021-07-17 ENCOUNTER — Other Ambulatory Visit: Payer: Self-pay

## 2021-07-17 VITALS — BP 134/84 | HR 87 | Ht 62.0 in | Wt 191.0 lb

## 2021-07-17 DIAGNOSIS — G43109 Migraine with aura, not intractable, without status migrainosus: Secondary | ICD-10-CM | POA: Diagnosis not present

## 2021-07-17 MED ORDER — NURTEC 75 MG PO TBDP: 75.0000 mg | ORAL_TABLET | ORAL | 0 refills | Status: DC | PRN
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
## 2021-07-17 NOTE — Patient Instructions (Addendum)
Start Botox injections every 3 months for migraine prevention. We will run this by your insurance, then the office will call you to schedule Take Nurtec as needed for migraines. Take one pill at first sign of migraine. Can repeat a dose in 24 hours if headache persists. Limit over the counter use to 2 days per week or less to avoid rebound headaches

## 2021-07-21 ENCOUNTER — Telehealth: Payer: Self-pay | Admitting: Psychiatry

## 2021-07-21 NOTE — Telephone Encounter (Signed)
Received signed patient consent to begin Botox injections. Dx: G43.109. Patient currently has [employer]. I used [employer]HC portal to obtain authorization for Botox. PA was approved. PA: [id number] (07/21/21- 01/19/22). [employer]HC requires use of [employer] for Botox. I have [facility] prescription to [facility] and will check status.

## 2021-07-23 NOTE — Telephone Encounter (Signed)
See patient's 10/4 MyChart message- patient also has [employer] [place] insurance. She provided me with her information & I added it to her chart. I have started Botox PA with [employer]. Gave form to MD to sign & will fax with OV notes.   Received denial from Cover My Meds for Botox through pharmacy benefit, likely because BCBS is her primary insurance and [facility] preferred specialty pharmacy) is not in network with [employer]. Received approval through [facility] medical benefit for Botox.   I will enroll patient in [facility] upon approval of Botox from [facility].

## 2021-07-30 NOTE — Telephone Encounter (Signed)
Received approval from [facility] for Botox. PA reference #[id number] (07/24/21- 01/08/22). Filled out prescription enrollment form for [facility], will give to MD to sign.

## 2021-07-31 ENCOUNTER — Ambulatory Visit: Payer: 59 | Admitting: Psychiatry

## 2021-08-04 ENCOUNTER — Ambulatory Visit: Payer: 59 | Admitting: Psychiatry

## 2021-08-11 NOTE — Telephone Encounter (Signed)
I called [employer] @ [phone] and spoke with Marsh Dolly to update the [facility] PA d/t change in specialty pharmacy ([facility]). Original PA is under [employer]. Marsh Dolly cancelled the old PA and created a new one to reflect [facility]. PA #[id number] (08/11/21- 08/11/22).

## 2021-08-12 ENCOUNTER — Other Ambulatory Visit: Payer: Self-pay | Admitting: Psychiatry

## 2021-08-12 MED ORDER — NURTEC 75 MG PO TBDP: 75.0000 mg | ORAL_TABLET | ORAL | 3 refills | Status: DC | PRN

## 2021-08-12 MED ORDER — DIVALPROEX SODIUM 500 MG PO DR TAB: DELAYED_RELEASE_TABLET | ORAL | 0 refills | Status: DC

## 2021-08-18 ENCOUNTER — Telehealth: Payer: Self-pay | Admitting: *Deleted

## 2021-08-18 NOTE — Telephone Encounter (Signed)
Nurtec PA, key [id number], G43.109. Treid/failed: Zomig 5 mg PRN Amerge 2.5 mg Maxalt Imitrex Topamax - caused blurred vision Toradol  Beta blocker - bradycardia Amitriptyline - lack of efficacy Excedrin migraine Phenergan 25 mg Zofran 4 mg Flexeril 10 mg Baclofen Emgality Aimovig Wellbutrin Ubrelvy Your information has been submitted to [employer] [place]. [employer] [place] will review the request and notify you of the determination decision directly, typically within 72 hours of receiving all information. If [employer] [place] has not responded within the specified timeframe or if you have any questions about your PA submission, contact Blue Cross  directly at [phone].

## 2021-08-19 ENCOUNTER — Encounter: Payer: Self-pay | Admitting: *Deleted

## 2021-08-19 NOTE — Telephone Encounter (Signed)
Nurtec approved Effective from 08/18/2021 through 11/09/2021. Sent her my chart to advise.

## 2021-08-20 NOTE — Progress Notes (Signed)
[facility]  BOTULINUM TOXIN INJECTION PROCEDURE NOTE  Patient: @PATIENTLASTNAME @, @PATIENTFIRSTNAME @   MRN:  Indication: Chronic migraines   History: 51 year old female with a history of Celiac disease who has has migraines since she was 50 years old. Currently has 30 headache days per month, 8 of which are migraines. Nurtec helps take the edge off of migraines if she takes it quickly enough.  Last injection: 1st injection today  Technique: Informed consent was obtained and signed. 200 unites onabotulinumtoxinA (Lot# [id number]; Expiration: 12/23) were reconstituted using normal saline, to a concentration of 5 units per 0.3ml. 155 units were injected using sterile technique across 31 sites as follows: corrugator 10, procerus 5 units, frontalis 20 units, temporalis 40 units, occipitalis 30 units, cervical paraspinal 20 units, trapezius 30 units. 45 units were wasted. Patient tolerated procedure without complication.  Plan: -Continue Nurtec for rescue -Return for Botox in 3 months  1/24 11/9/022 1:50 PM

## 2021-08-22 ENCOUNTER — Other Ambulatory Visit: Payer: Self-pay | Admitting: Psychiatry

## 2021-08-27 ENCOUNTER — Encounter: Payer: Self-pay | Admitting: Psychiatry

## 2021-08-27 ENCOUNTER — Ambulatory Visit (INDEPENDENT_AMBULATORY_CARE_PROVIDER_SITE_OTHER): Payer: BC Managed Care – PPO | Admitting: Psychiatry

## 2021-08-27 VITALS — BP 122/84 | HR 89

## 2021-08-27 DIAGNOSIS — G43719 Chronic migraine without aura, intractable, without status migrainosus: Secondary | ICD-10-CM

## 2021-08-27 NOTE — Progress Notes (Signed)
Botox- 200 units x 1 vial Lot: [id number] Expiration: 4/25 NDC: 5747-3403-70  Bacteriostatic 0.9% Sodium Chloride- 25mL total [id number] Expiration: 12/23 NDC: 03754-360-67  Dx: G43.109   SP

## 2021-09-04 ENCOUNTER — Ambulatory Visit: Payer: BC Managed Care – PPO | Admitting: Psychiatry

## 2021-09-17 ENCOUNTER — Ambulatory Visit: Payer: 59 | Admitting: Neurology

## 2021-10-09 ENCOUNTER — Other Ambulatory Visit: Payer: Self-pay

## 2021-10-09 ENCOUNTER — Emergency Department
Admission: RE | Admit: 2021-10-09 | Discharge: 2021-10-09 | Discharge status: Absent without leave | Payer: Self-pay | Source: Ambulatory Visit

## 2021-10-09 ENCOUNTER — Emergency Department
Admission: EM | Admit: 2021-10-09 | Discharge: 2021-10-09 | Disposition: A | Discharge status: Home / Self Care | Payer: BC Managed Care – PPO | Source: Home / Self Care

## 2021-10-09 ENCOUNTER — Encounter: Payer: Self-pay | Admitting: Psychiatry

## 2021-10-09 DIAGNOSIS — J029 Acute pharyngitis, unspecified: Secondary | ICD-10-CM

## 2021-10-09 NOTE — ED Provider Notes (Signed)
[facility]    CSN: [id number] Arrival date & time: 10/09/21  1637      History   Chief Complaint Chief Complaint  Patient presents with   Sore Throat   Cough    HPI Janice Willis is a 50 y.o. female.   HPI 50 year old female presents with sore throat, sneezing, and mild cough since yesterday.  Patient reports having Influenza 2 weeks ago.  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      Home Medications    Prior to Admission medications   Medication Sig Start Date End Date Taking? Authorizing Provider  buPROPion (WELLBUTRIN SR) 150 MG 12 hr tablet Take by mouth. 08/15/20   [provider]  cyclobenzaprine (FLEXERIL) 10 MG tablet Take one tablet once or twice daily for muscle spasms. 01/02/20   Hyatt, Max T, DPM  doxycycline (VIBRA-TABS) 100 MG tablet Take 1 tablet (100 mg total) by mouth 2 (two) times daily. 09/04/20   Hyatt, Max T, DPM  fluticasone (FLONASE) 50 MCG/ACT nasal spray INSTILL 2 SPRAYS IN EACH NOSTRIL ONCE DAILY 01/31/20   [provider]  hydrochlorothiazide (HYDRODIURIL) 25 MG tablet Take 25 mg by mouth daily. 06/22/21   [provider]  ibuprofen (ADVIL) 800 MG tablet Take by mouth.    [provider]  montelukast (SINGULAIR) 10 MG tablet Take by mouth. 05/27/21   [provider]  omeprazole (PRILOSEC) 20 MG capsule Take by mouth. 05/27/21   [provider]  ondansetron (ZOFRAN) 4 MG tablet Take 1 tablet (4 mg total) by mouth every 8 (eight) hours as needed. 09/04/20   Hyatt, Max T, DPM  Rimegepant Sulfate (NURTEC) 75 MG TBDP Take 75 mg by mouth as needed. 08/12/21   Ocie Doyne, MD  zolmitriptan (ZOMIG) 5 MG tablet Take by mouth. 07/08/21   [provider]    Family History Family History  Problem Relation Age of Onset   Cancer Mother 58       colon   Diabetes Brother    Bipolar disorder Brother    Mental retardation Brother     Social History Social History   Tobacco Use   Smoking status: Never   Smokeless tobacco: Never  Vaping Use   Vaping Use: Never used  Substance Use Topics   Alcohol use: Not Currently   Drug use: Never     Allergies   Codeine, Fluoxetine hcl, Methadone, Methylprednisolone, Penicillins, Venlafaxine, Ciprofloxacin, Clindamycin/lincomycin, Percocet [oxycodone-acetaminophen], Prednisone, Propoxyphene, and Sulfamethoxazole-trimethoprim   Review of Systems Review of Systems  HENT:  Positive for sore throat.   Respiratory:  Positive for cough.   All other systems reviewed and are negative.   Physical Exam Triage Vital Signs ED Triage Vitals  Enc Vitals Group  BP      Pulse      Resp      Temp      Temp src      SpO2      Weight      Height      Head Circumference      Peak Flow      Pain Score      Pain Loc      Pain Edu?      Excl. in [place]?    No data found.  Updated Vital Signs BP 134/84 (BP Location: Right Arm)    Pulse 88    Temp 98.3 F (36.8 C) (Oral)    Resp 18    Ht 5\' 2"  (1.575 m)    Wt 181 lb (82.1 kg)    SpO2 97%    BMI 33.11 kg/m   Physical Exam Vitals and nursing note reviewed.  Constitutional:      Appearance: Normal appearance. She is obese.  HENT:     Head: Normocephalic and atraumatic.     Right Ear: Tympanic membrane, ear canal and external ear normal.      Left Ear: Tympanic membrane, ear canal and external ear normal.     Mouth/Throat:     Mouth: Mucous membranes are moist.     Pharynx: Oropharynx is clear.  Eyes:     Extraocular Movements: Extraocular movements intact.     Conjunctiva/sclera: Conjunctivae normal.     Pupils: Pupils are equal, round, and reactive to light.  Cardiovascular:     Rate and Rhythm: Normal rate and regular rhythm.     Pulses: Normal pulses.     Heart sounds: Normal heart sounds.  Pulmonary:     Effort: Pulmonary effort is normal.     Breath sounds: Normal breath sounds.  Musculoskeletal:     Cervical back: Normal range of motion and neck supple.  Skin:    General: Skin is warm and dry.  Neurological:     General: No focal deficit present.     Mental Status: She is alert and oriented to person, place, and time.     UC Treatments / Results  Labs (all labs ordered are listed, but only abnormal results are displayed) Labs Reviewed - No data to display  EKG   Radiology No results found.  Procedures Procedures (including critical care time)  Medications Ordered in UC Medications - No data to display  Initial Impression / Assessment and Plan / UC Course  I have reviewed the triage vital signs and the nursing notes.  Pertinent labs & imaging results that were available during my care of the patient were reviewed by me and considered in my medical decision making (see chart for details).     MDM: 1.  Sore throat-Advised patient may use Tylenol 500 to 1000 mg 1-2 times daily, as needed alternating with Ibuprofen 600 mg 1-2 times daily, as needed for sore throat.  Encouraged patient to increase daily water intake while taking these medications.  Patient discharged home, hemodynamically stable. Final Clinical Impressions(s) / UC Diagnoses   Final diagnoses:  Sore throat     Discharge Instructions      Advised patient may use Tylenol 500 to 1000 mg 1-2 times daily, as needed alternating with  Ibuprofen 600 mg 1-2 times daily, as needed for sore throat.  Encouraged patient to increase daily water intake while taking these medications.     ED Prescriptions   None  PDMP not reviewed this encounter.   Eliezer Lofts, [place] 10/09/21 1727

## 2021-10-09 NOTE — ED Triage Notes (Signed)
Pt presents to Urgent Care with c/o sore throat, sneezing, and "mild cough" since yesterday. No COVID test done. Reports having influenza 2 weeks ago.

## 2021-10-09 NOTE — Discharge Instructions (Addendum)
Advised patient may use Tylenol 500 to 1000 mg 1-2 times daily, as needed alternating with Ibuprofen 600 mg 1-2 times daily, as needed for sore throat.  Encouraged patient to increase daily water intake while taking these medications.

## 2021-11-24 ENCOUNTER — Ambulatory Visit: Payer: BC Managed Care – PPO | Admitting: Psychiatry

## 2021-11-27 ENCOUNTER — Encounter: Payer: Self-pay | Admitting: Psychiatry

## 2021-11-27 ENCOUNTER — Ambulatory Visit (INDEPENDENT_AMBULATORY_CARE_PROVIDER_SITE_OTHER): Payer: BC Managed Care – PPO | Admitting: Psychiatry

## 2021-11-27 ENCOUNTER — Other Ambulatory Visit: Payer: Self-pay

## 2021-11-27 VITALS — BP 167/95 | HR 88

## 2021-11-27 DIAGNOSIS — G43711 Chronic migraine without aura, intractable, with status migrainosus: Secondary | ICD-10-CM | POA: Diagnosis not present

## 2021-11-27 MED ORDER — NURTEC 75 MG PO TBDP
75.0000 mg | ORAL_TABLET | ORAL | 3 refills | Status: DC
Start: 2021-11-27 — End: 2022-02-19

## 2021-11-27 MED ORDER — DICLOFENAC POTASSIUM 50 MG PO TABS: 50.0000 mg | ORAL_TABLET | Freq: Three times a day (TID) | ORAL | 0 refills | Status: AC

## 2021-11-27 MED ORDER — NURTEC 75 MG PO TBDP
75.0000 mg | ORAL_TABLET | ORAL | 3 refills | Status: DC | PRN
Start: 2021-11-27 — End: 2021-11-27

## 2021-11-27 NOTE — Progress Notes (Signed)
[facility]  BOTULINUM TOXIN INJECTION PROCEDURE NOTE  Patient: Janice Willis   MRN: [id number]  Indication: Chronic migraines   History: 51 year old female with a history of Celiac disease who has has migraines since she was 51 years old. Only had one headache in December after Botox, but then daily headaches returned the next month. Nurtec helps for about 6 hours, but headache returns after this.  Last injection: 08/27/21  Technique: Informed consent was obtained and signed. 200 units onabotulinumtoxinA (Lot: [id number], Exp: 06/2024) were reconstituted using normal saline, to a concentration of 5 units per 0.70ml. 155 units were injected using sterile technique across 31 sites as follows: corrugator 10, procerus 5 units, frontalis 20 units, temporalis 40 units, occipitalis 30 units, cervical paraspinal 20 units, trapezius 30 units. 45 units were wasted. Patient tolerated procedure without complication.  Plan: -Start taking Nurtec every other day for headache prevention -Consider Qulipta for headache prevention if no improvement with Botox/Nurtec -Return for Botox in 3 months  Genia Harold 11/27/21 3:29 PM

## 2021-11-27 NOTE — Patient Instructions (Signed)
Take diclofenac three times a day for 5 days to help break headache cycle Take Nurtec every other day for headache prevention

## 2021-11-27 NOTE — Progress Notes (Signed)
Botox- 200 units x 1 vials Lot: [id number]   Expiration: 06/2024 NDC:0023-3921-02   Bacteriostatic 0.9% Sodium Chloride- 1mL total Lot: [id number] Expiration: 10/2022 NDC: [id number]   Dx: [id number]

## 2021-12-01 ENCOUNTER — Telehealth: Payer: Self-pay

## 2021-12-01 NOTE — Telephone Encounter (Signed)
PA for Nurtec has been sent.  ([id number])  Your information has been submitted to [place]. Blue Cross [place] will review the request and notify you of the determination decision directly, typically within 72 hours of receiving all information.  You will also receive your request decision electronically. To check for an update later, open this request again from your dashboard.  If [employer] [place] has not responded within the specified timeframe or if you have any questions about your PA submission, contact [place] directly at [phone].

## 2021-12-01 NOTE — Telephone Encounter (Signed)
PCP should be able to fill this out since she was taking time off due to depression

## 2021-12-02 NOTE — Telephone Encounter (Signed)
Received fax from [facility] needs more information and I have sent. Should receive feed back in 24-72 hours.

## 2021-12-04 NOTE — Telephone Encounter (Signed)
I called [employer] and spoke with Rennis Harding. He sts PA is still pending at clincial intake should know determination by tomorrow afternoon.  Ref # for the call: [id number]

## 2021-12-08 NOTE — Telephone Encounter (Signed)
Received determination for Nurtec.  [employer]BS has denied coverage for Nurtec 75 ODT for prevention of migraine/ h/a # 16 for 30 day. Coverage was denied for 2 reasons, had botox within the past 3 month and the formulary alternative is Ajovy.   Will fwd to MD For review.

## 2021-12-08 NOTE — Telephone Encounter (Signed)
She can continue taking Nurtec as needed for now. She just had her second session of Botox so hopefully headache frequency will improve with that

## 2021-12-08 NOTE — Telephone Encounter (Signed)
I have sent my chart message on this recommendation to the pt.

## 2022-01-08 ENCOUNTER — Telehealth: Payer: Self-pay | Admitting: Psychiatry

## 2022-01-08 NOTE — Telephone Encounter (Signed)
Completed Botox continuation form, placed in Nurse Pod for MD signature. ?

## 2022-01-12 NOTE — Telephone Encounter (Signed)
Faxed signed PA form with OV notes to [employer]. ?

## 2022-01-27 ENCOUNTER — Encounter: Payer: Self-pay | Admitting: Psychiatry

## 2022-01-27 NOTE — Telephone Encounter (Signed)
Initiated PA for Botox on [facility] Portal. PA #  [id number] (01/27/2022-01/28/2023). ?

## 2022-02-10 ENCOUNTER — Encounter: Payer: Self-pay | Admitting: Psychiatry

## 2022-02-16 ENCOUNTER — Encounter: Payer: Self-pay | Admitting: Psychiatry

## 2022-02-19 ENCOUNTER — Encounter: Payer: Self-pay | Admitting: Psychiatry

## 2022-02-19 ENCOUNTER — Ambulatory Visit (INDEPENDENT_AMBULATORY_CARE_PROVIDER_SITE_OTHER): Payer: 59 | Admitting: Psychiatry

## 2022-02-19 VITALS — BP 128/78 | HR 92

## 2022-02-19 DIAGNOSIS — G43119 Migraine with aura, intractable, without status migrainosus: Secondary | ICD-10-CM | POA: Diagnosis not present

## 2022-02-19 MED ORDER — QULIPTA 60 MG PO TABS: 60.0000 mg | ORAL_TABLET | Freq: Every day | ORAL | 3 refills | Status: DC

## 2022-02-19 NOTE — Progress Notes (Signed)
Botox- 200 units x 1 vial ?Lot: [id number] ?Expiration: 09/2024 ?[place]: [phone] ?  ?Bacteriostatic 0.9% Sodium Chloride- 25mL total ?[id number] ?Expiration: 11/2022 ?[place]: [id number] ?  ?Dx: [id number] ?BB ?  ?

## 2022-02-19 NOTE — Progress Notes (Signed)
[facility] ? ?BOTULINUM TOXIN INJECTION PROCEDURE NOTE ? ?Patient: Janice Willis   ?MRN: [id number] ? ?Indication: Chronic migraines  ? ?History: 51 year old female with a history of Celiac disease who has has migraines since she was 51 years old. Headaches have improved since her last Botox session, down to 4 headaches per month. She was not able to get Nurtec every other day approved and would like to try Qulipta. ? ?Last injection: 11/27/21 ? ?Technique: Informed consent was obtained and signed. 200 units onabotulinumtoxinA (Lot# [id number]; Expiration: 09/2024) were reconstituted using normal saline, to a concentration of 5 units per 0.10ml. 155 units were injected using sterile technique across 31 sites as follows: corrugator 10, procerus 5 units, frontalis 20 units, temporalis 40 units, occipitalis 30 units, cervical paraspinal 20 units, trapezius 30 units. 45 units were wasted. Patient tolerated procedure without complication. ? ?Plan: ?-Start Qulipta 60 mg daily ?-Continue Botox every 3 months ? ?Anderson [place] Khari Lett ?02/19/22 ?2:50 PM ? ?

## 2022-02-23 ENCOUNTER — Ambulatory Visit: Payer: BC Managed Care – PPO | Admitting: Psychiatry

## 2022-02-24 ENCOUNTER — Encounter: Payer: Self-pay | Admitting: *Deleted

## 2022-02-24 ENCOUNTER — Telehealth: Payer: Self-pay | Admitting: *Deleted

## 2022-02-24 NOTE — Telephone Encounter (Signed)
Lenoria Chime PA, [place]: [id number].  Your information has been sent to [facility]. ?

## 2022-02-24 NOTE — Telephone Encounter (Signed)
Yes please let's see if she can get the savings card

## 2022-02-24 NOTE — Telephone Encounter (Signed)
Lenoria Chime denied:  ?This request was denied because you did not meet the following clinical requirements: ?The requested medication and/or diagnosis are not a covered benefit and excluded from coverage in accordance with the terms and conditions of your plan benefit. Therefore, the request has been administratively denied. ? ?

## 2022-03-10 ENCOUNTER — Other Ambulatory Visit: Payer: Self-pay

## 2022-03-10 DIAGNOSIS — R109 Unspecified abdominal pain: Secondary | ICD-10-CM

## 2022-03-10 DIAGNOSIS — K219 Gastro-esophageal reflux disease without esophagitis: Secondary | ICD-10-CM

## 2022-03-12 ENCOUNTER — Ambulatory Visit: Payer: 59 | Admitting: Gastroenterology

## 2022-03-12 ENCOUNTER — Encounter: Payer: Self-pay | Admitting: Gastroenterology

## 2022-03-12 VITALS — BP 131/87 | HR 91 | Temp 98.2°F | Ht 62.25 in | Wt 172.0 lb

## 2022-03-12 DIAGNOSIS — R6881 Early satiety: Secondary | ICD-10-CM

## 2022-03-12 DIAGNOSIS — K625 Hemorrhage of anus and rectum: Secondary | ICD-10-CM

## 2022-03-12 MED ORDER — LUBIPROSTONE 24 MCG PO CAPS: 24.0000 ug | ORAL_CAPSULE | Freq: Two times a day (BID) | ORAL | 3 refills | Status: DC

## 2022-03-12 MED ORDER — CLENPIQ 10-3.5-12 MG-GM -GM/160ML PO SOLN: 1.0000 | Freq: Once | ORAL | 0 refills | Status: AC

## 2022-03-12 NOTE — Progress Notes (Signed)
Gastroenterology Consultation  Referring Provider:     Despina Pole, PA Primary Care Physician:  Despina Pole, [place] Primary Gastroenterologist:  Dr. Allen Norris     Reason for Consultation:     Rectal bleeding        HPI:   Damisha Heiden is a 51 y.o. y/o female referred for consultation & management of rectal bleeding by Dr. Elmyra Ricks, Georgena Spurling, PA.  This patient comes in today with rectal bleeding.  The patient states that she has not had any rectal bleeding recently but did have rectal bleeding that concerned her.  She also reports that she has lost approximately 20 pounds in the last 4 months.  She has a history of IBS with constipation and a history of celiac sprue.  She states that she tries to be mostly on a gluten-free diet.  She has not had any further rectal bleeding but it concerned her because her mother had died of colon cancer at the age of 35.  The patient reports that I had seen her husband in the past for an ERCP.  There is no report of any nausea vomiting but the patient does report that she has abdominal pain when she is constipated.  Past Medical History:  Diagnosis Date   Anxiety state    Celiac disease    Dysfunction of eustachian tube    Migraine headache    Mitral valve disorder     Past Surgical History:  Procedure Laterality Date   ABDOMINAL HYSTERECTOMY     ANKLE SURGERY Left    x2   TONSILLECTOMY      Prior to Admission medications   Medication Sig Start Date End Date Taking? Authorizing Provider  Atogepant (QULIPTA) 60 MG TABS Take 60 mg by mouth daily. 02/19/22  Yes Genia Harold, MD  buPROPion [facility] SR) 150 MG 12 hr tablet Take by mouth. 08/15/20  Yes [provider]  cyclobenzaprine (FLEXERIL) 10 MG tablet Take one tablet once or twice daily for muscle spasms. 01/02/20  Yes Hyatt, Max T, DPM  FLUoxetine (PROZAC) 20 MG capsule Take 20 mg by mouth daily. 01/23/22  Yes [provider]  fluticasone (FLONASE) 50 MCG/ACT nasal spray  INSTILL 2 SPRAYS IN EACH NOSTRIL ONCE DAILY 01/31/20  Yes [provider]  hydrochlorothiazide (HYDRODIURIL) 25 MG tablet Take 25 mg by mouth daily. 06/22/21  Yes [provider]  ibuprofen (ADVIL) 800 MG tablet Take by mouth.   Yes [provider]  montelukast (SINGULAIR) 10 MG tablet Take by mouth. 05/27/21  Yes [provider]  omeprazole (PRILOSEC) 20 MG capsule Take by mouth. 05/27/21  Yes [provider]  ondansetron (ZOFRAN) 4 MG tablet Take 1 tablet (4 mg total) by mouth every 8 (eight) hours as needed. 09/04/20  Yes Hyatt, Max T, DPM    Family History  Problem Relation Age of Onset   Cancer Mother 48       colon   Diabetes Brother    Bipolar disorder Brother    Mental retardation Brother      Social History   Tobacco Use   Smoking status: Never   Smokeless tobacco: Never  Vaping Use   Vaping Use: Never used  Substance Use Topics   Alcohol use: Not Currently   Drug use: Never    Allergies as of 03/12/2022 - Review Complete 03/12/2022  Allergen Reaction Noted   Codeine Rash 07/27/2019   Methylprednisolone Hives 06/03/2017   Penicillins Rash 04/20/2011  Venlafaxine  04/20/2011   Ciprofloxacin  04/20/2011   Clindamycin/lincomycin Itching 04/20/2011   Hydrocodone-acetaminophen Hives 07/27/2019   Percocet [oxycodone-acetaminophen] Swelling 12/14/2019   Prednisone Hives 04/20/2011   Propoxyphene  04/20/2011   Sulfamethoxazole-trimethoprim Rash 04/20/2011    Review of Systems:    All systems reviewed and negative except where noted in HPI.   Physical Exam:  BP 131/87   Pulse 91   Temp 98.2 F (36.8 C) (Oral)   Ht 5' 2.25" (1.581 m)   Wt 172 lb (78 kg)   BMI 31.21 kg/m  No LMP recorded. Patient has had a hysterectomy. General:   Alert,  Well-developed, well-nourished, pleasant and cooperative in NAD Head:  Normocephalic and atraumatic. Eyes:  Sclera clear, no icterus.   Conjunctiva pink. Ears:  Normal auditory  acuity. Neck:  Supple; no masses or thyromegaly. Lungs:  Respirations even and unlabored.  Clear throughout to auscultation.   No wheezes, crackles, or rhonchi. No acute distress. Heart:  Regular rate and rhythm; no murmurs, clicks, rubs, or gallops. Abdomen:  Normal bowel sounds.  No bruits.  Soft, non-tender and non-distended without masses, hepatosplenomegaly or hernias noted.  No guarding or rebound tenderness.  Negative Carnett sign.   Rectal:  Deferred.  Pulses:  Normal pulses noted. Extremities:  No clubbing or edema.  No cyanosis. Neurologic:  Alert and oriented x3;  grossly normal neurologically. Skin:  Intact without significant lesions or rashes.  No jaundice. Lymph Nodes:  No significant cervical adenopathy. Psych:  Alert and cooperative. Normal mood and affect.  Imaging Studies: No results found.  Assessment and Plan:   Sarahanne Daniele is a 51 y.o. y/o female who comes in today with a history of rectal bleeding that is since stopped.  It was worse when she was more constipated.  The patient also reports that she has early satiety and with her mother dying at the age of 49 from colon cancer she is concerned about her GI symptoms at the present time.  The patient will be set up for an EGD and colonoscopy to look for cause of her rectal bleeding and for her early satiety.  The patient has been encouraged to continue on her celiac sprue diet meaning that she should avoid gluten.  The patient has been explained the plan agrees with it.    Lucilla Lame, MD. Marval Regal    Note: This dictation was prepared with Dragon dictation along with smaller phrase technology. Any transcriptional errors that result from this process are unintentional.

## 2022-03-12 NOTE — H&P (View-Only) (Signed)
Gastroenterology Consultation  Referring Provider:     Despina Pole, PA Primary Care Physician:  Despina Pole, [place] Primary Gastroenterologist:  Dr. Allen Norris     Reason for Consultation:     Rectal bleeding        HPI:   Janice Willis is a 51 y.o. y/o female referred for consultation & management of rectal bleeding by Dr. Elmyra Ricks, Georgena Spurling, PA.  This patient comes in today with rectal bleeding.  The patient states that she has not had any rectal bleeding recently but did have rectal bleeding that concerned her.  She also reports that she has lost approximately 20 pounds in the last 4 months.  She has a history of IBS with constipation and a history of celiac sprue.  She states that she tries to be mostly on a gluten-free diet.  She has not had any further rectal bleeding but it concerned her because her mother had died of colon cancer at the age of 71.  The patient reports that I had seen her husband in the past for an ERCP.  There is no report of any nausea vomiting but the patient does report that she has abdominal pain when she is constipated.  Past Medical History:  Diagnosis Date   Anxiety state    Celiac disease    Dysfunction of eustachian tube    Migraine headache    Mitral valve disorder     Past Surgical History:  Procedure Laterality Date   ABDOMINAL HYSTERECTOMY     ANKLE SURGERY Left    x2   TONSILLECTOMY      Prior to Admission medications   Medication Sig Start Date End Date Taking? Authorizing Provider  Atogepant (QULIPTA) 60 MG TABS Take 60 mg by mouth daily. 02/19/22  Yes Genia Harold, MD  buPROPion [facility] SR) 150 MG 12 hr tablet Take by mouth. 08/15/20  Yes [provider]  cyclobenzaprine (FLEXERIL) 10 MG tablet Take one tablet once or twice daily for muscle spasms. 01/02/20  Yes Hyatt, Max T, DPM  FLUoxetine (PROZAC) 20 MG capsule Take 20 mg by mouth daily. 01/23/22  Yes [provider]  fluticasone (FLONASE) 50 MCG/ACT nasal spray  INSTILL 2 SPRAYS IN EACH NOSTRIL ONCE DAILY 01/31/20  Yes [provider]  hydrochlorothiazide (HYDRODIURIL) 25 MG tablet Take 25 mg by mouth daily. 06/22/21  Yes [provider]  ibuprofen (ADVIL) 800 MG tablet Take by mouth.   Yes [provider]  montelukast (SINGULAIR) 10 MG tablet Take by mouth. 05/27/21  Yes [provider]  omeprazole (PRILOSEC) 20 MG capsule Take by mouth. 05/27/21  Yes [provider]  ondansetron (ZOFRAN) 4 MG tablet Take 1 tablet (4 mg total) by mouth every 8 (eight) hours as needed. 09/04/20  Yes Hyatt, Max T, DPM    Family History  Problem Relation Age of Onset   Cancer Mother 21       colon   Diabetes Brother    Bipolar disorder Brother    Mental retardation Brother      Social History   Tobacco Use   Smoking status: Never   Smokeless tobacco: Never  Vaping Use   Vaping Use: Never used  Substance Use Topics   Alcohol use: Not Currently   Drug use: Never    Allergies as of 03/12/2022 - Review Complete 03/12/2022  Allergen Reaction Noted   Codeine Rash 07/27/2019   Methylprednisolone Hives 06/03/2017   Penicillins Rash 04/20/2011  Venlafaxine  04/20/2011   Ciprofloxacin  04/20/2011   Clindamycin/lincomycin Itching 04/20/2011   Hydrocodone-acetaminophen Hives 07/27/2019   Percocet [oxycodone-acetaminophen] Swelling 12/14/2019   Prednisone Hives 04/20/2011   Propoxyphene  04/20/2011   Sulfamethoxazole-trimethoprim Rash 04/20/2011    Review of Systems:    All systems reviewed and negative except where noted in HPI.   Physical Exam:  BP 131/87   Pulse 91   Temp 98.2 F (36.8 C) (Oral)   Ht 5' 2.25" (1.581 m)   Wt 172 lb (78 kg)   BMI 31.21 kg/m  No LMP recorded. Patient has had a hysterectomy. General:   Alert,  Well-developed, well-nourished, pleasant and cooperative in NAD Head:  Normocephalic and atraumatic. Eyes:  Sclera clear, no icterus.   Conjunctiva pink. Ears:  Normal auditory  acuity. Neck:  Supple; no masses or thyromegaly. Lungs:  Respirations even and unlabored.  Clear throughout to auscultation.   No wheezes, crackles, or rhonchi. No acute distress. Heart:  Regular rate and rhythm; no murmurs, clicks, rubs, or gallops. Abdomen:  Normal bowel sounds.  No bruits.  Soft, non-tender and non-distended without masses, hepatosplenomegaly or hernias noted.  No guarding or rebound tenderness.  Negative Carnett sign.   Rectal:  Deferred.  Pulses:  Normal pulses noted. Extremities:  No clubbing or edema.  No cyanosis. Neurologic:  Alert and oriented x3;  grossly normal neurologically. Skin:  Intact without significant lesions or rashes.  No jaundice. Lymph Nodes:  No significant cervical adenopathy. Psych:  Alert and cooperative. Normal mood and affect.  Imaging Studies: No results found.  Assessment and Plan:   Janice Willis is a 51 y.o. y/o female who comes in today with a history of rectal bleeding that is since stopped.  It was worse when she was more constipated.  The patient also reports that she has early satiety and with her mother dying at the age of 35 from colon cancer she is concerned about her GI symptoms at the present time.  The patient will be set up for an EGD and colonoscopy to look for cause of her rectal bleeding and for her early satiety.  The patient has been encouraged to continue on her celiac sprue diet meaning that she should avoid gluten.  The patient has been explained the plan agrees with it.    Lucilla Lame, MD. Marval Regal    Note: This dictation was prepared with Dragon dictation along with smaller phrase technology. Any transcriptional errors that result from this process are unintentional.

## 2022-03-16 ENCOUNTER — Encounter: Payer: Self-pay | Admitting: Gastroenterology

## 2022-03-17 MED ORDER — PEG 3350-KCL-NABCB-NACL-NASULF 236 G PO SOLR: 4000.0000 mL | Freq: Once | ORAL | 0 refills | Status: AC

## 2022-03-19 ENCOUNTER — Ambulatory Visit: Payer: 59 | Admitting: Psychiatry

## 2022-03-20 ENCOUNTER — Ambulatory Visit
Admission: EM | Admit: 2022-03-20 | Discharge: 2022-03-20 | Disposition: A | Discharge status: Home / Self Care | Payer: 59 | Attending: Family Medicine | Admitting: Family Medicine

## 2022-03-20 DIAGNOSIS — N390 Urinary tract infection, site not specified: Secondary | ICD-10-CM | POA: Diagnosis not present

## 2022-03-20 LAB — POCT URINALYSIS DIP (MANUAL ENTRY)
Glucose, UA: NEGATIVE mg/dL
Nitrite, UA: POSITIVE — AB
Protein Ur, POC: >=300 mg/dL — AB
Spec Grav, UA: >=1.03 — AB (ref 1.010–1.025)
Urobilinogen, UA: 2 E.U./dL — AB
pH, UA: 6.5 (ref 5.0–8.0)

## 2022-03-20 MED ORDER — NITROFURANTOIN MONOHYD MACRO 100 MG PO CAPS: 100.0000 mg | ORAL_CAPSULE | Freq: Two times a day (BID) | ORAL | 0 refills | Status: DC

## 2022-03-20 NOTE — ED Provider Notes (Signed)
[facility]    CSN: [id number] Arrival date & time: 03/20/22  1237      History   Chief Complaint Chief Complaint  Patient presents with   UTI symptoms    HPI Janice Willis is a 51 y.o. female.   Presenting today with 4-day history of progressively worsening dysuria, urinary frequency, suprapubic pressure and now this morning started having blood in her urine.  Denies fever, chills, nausea, vomiting, vaginal symptoms, flank pain.  Trying Azo with minimal relief.  Following currently with urology as she has had frequent urinary tract infections over the past 6 months.   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      Home Medications    Prior to Admission medications   Medication Sig Start Date End Date Taking? Authorizing Provider  nitrofurantoin, macrocrystal-monohydrate, (MACROBID) 100 MG capsule Take 1 capsule (100 mg total) by mouth 2 (two) times daily. 03/20/22  Yes Particia Nearing, PA-C  Atogepant (QULIPTA) 60 MG TABS Take 60 mg by mouth daily. 02/19/22   Ocie Doyne, MD  buPROPion (WELLBUTRIN SR) 150 MG 12 hr tablet Take by mouth. 08/15/20   [provider]  cyclobenzaprine (FLEXERIL) 10 MG tablet Take one tablet once or twice daily for muscle spasms. 01/02/20   Hyatt, Max T, DPM  FLUoxetine (PROZAC) 20 MG capsule  Take 20 mg by mouth daily. 01/23/22   [provider]  fluticasone (FLONASE) 50 MCG/ACT nasal spray INSTILL 2 SPRAYS IN EACH NOSTRIL ONCE DAILY 01/31/20   [provider]  hydrochlorothiazide (HYDRODIURIL) 25 MG tablet Take 25 mg by mouth daily. 06/22/21   [provider]  ibuprofen (ADVIL) 800 MG tablet Take by mouth.    [provider]  lubiprostone (AMITIZA) 24 MCG capsule Take 1 capsule (24 mcg total) by mouth 2 (two) times daily with a meal. 03/12/22   Midge Minium, MD  montelukast (SINGULAIR) 10 MG tablet Take by mouth. 05/27/21   [provider]  omeprazole (PRILOSEC) 20 MG capsule Take by mouth. 05/27/21   [provider]  ondansetron (ZOFRAN) 4 MG tablet Take 1 tablet (4 mg total) by mouth every 8 (eight) hours as needed. 09/04/20   Hyatt, Max T, DPM    Family History Family History  Problem Relation Age of Onset   Cancer Mother 60       colon   Diabetes Brother    Bipolar disorder Brother    Mental retardation Brother     Social History Social History   Tobacco Use   Smoking status: Never   Smokeless tobacco: Never  Vaping Use   Vaping Use: Never used  Substance Use Topics   Alcohol use: Not Currently   Drug use: Never     Allergies  Codeine, Methylprednisolone, Penicillins, Venlafaxine, Ciprofloxacin, Clindamycin/lincomycin, Hydrocodone-acetaminophen, Percocet [oxycodone-acetaminophen], Prednisone, Propoxyphene, and Sulfamethoxazole-trimethoprim   Review of Systems Review of Systems Per HPI  Physical Exam Triage Vital Signs ED Triage Vitals  Enc Vitals Group     BP 03/20/22 1250 140/88     Pulse Rate 03/20/22 1250 95     Resp 03/20/22 1250 18     Temp 03/20/22 1250 99 F (37.2 C)     Temp Source 03/20/22 1250 Oral     SpO2 03/20/22 1250 97 %     Weight --      Height --      Head Circumference --      Peak Flow --      Pain Score 03/20/22 1248 8     Pain Loc --      Pain Edu? --      Excl. in [place]? --     No data found.  Updated Vital Signs BP 140/88 (BP Location: Right Arm)   Pulse 95   Temp 99 F (37.2 C) (Oral)   Resp 18   SpO2 97%   Visual Acuity Right Eye Distance:   Left Eye Distance:   Bilateral Distance:    Right Eye Near:   Left Eye Near:    Bilateral Near:     Physical Exam Vitals and nursing note reviewed.  Constitutional:      Appearance: Normal appearance. She is not ill-appearing.  HENT:     Head: Atraumatic.  Eyes:     Extraocular Movements: Extraocular movements intact.     Conjunctiva/sclera: Conjunctivae normal.  Cardiovascular:     Rate and Rhythm: Normal rate and regular rhythm.     Heart sounds: Normal heart sounds.  Pulmonary:     Effort: Pulmonary effort is normal.     Breath sounds: Normal breath sounds.  Abdominal:     General: Bowel sounds are normal. There is no distension.     Palpations: Abdomen is soft.     Tenderness: There is no abdominal tenderness. There is no right CVA tenderness, left CVA tenderness or guarding.  Musculoskeletal:        General: Normal range of motion.     Cervical back: Normal range of motion and neck supple.  Skin:    General: Skin is warm and dry.  Neurological:     Mental Status: She is alert and oriented to person, place, and time.     Motor: No weakness.     Gait: Gait normal.  Psychiatric:        Mood and Affect: Mood normal.        Thought Content: Thought content normal.        Judgment: Judgment normal.   UC Treatments / Results  Labs (all labs ordered are listed, but only abnormal results are displayed) Labs Reviewed  POCT URINALYSIS DIP (MANUAL ENTRY) - Abnormal; Notable for the following components:      Result Value   Color, UA brown (*)    Clarity, UA cloudy (*)    Bilirubin, UA small (*)    Ketones, POC UA trace (5) (*)    Spec Grav, UA >=1.030 (*)    Blood, UA large (*)    Protein Ur, POC >=300 (*)    Urobilinogen, UA 2.0 (*)    Nitrite, UA Positive (*)    Leukocytes, UA Large  (3+) (*)    All other components within normal limits  URINE CULTURE   EKG  Radiology No  results found.  Procedures Procedures (including critical care time)  Medications Ordered in UC Medications - No data to display  Initial Impression / Assessment and Plan / UC Course  I have reviewed the triage vital signs and the nursing notes.  Pertinent labs & imaging results that were available during my care of the patient were reviewed by me and considered in my medical decision making (see chart for details).     Vitals and exam benign and reassuring today, urinalysis showing evidence of urinary tract infection.  Urine culture pending, treat with Macrobid while awaiting results.  Return for worsening symptoms.  Final Clinical Impressions(s) / UC Diagnoses   Final diagnoses:  Acute lower UTI   Discharge Instructions   None    ED Prescriptions     Medication Sig Dispense Auth. Provider   nitrofurantoin, macrocrystal-monohydrate, (MACROBID) 100 MG capsule Take 1 capsule (100 mg total) by mouth 2 (two) times daily. 10 capsule Volney American, [place]      PDMP not reviewed this encounter.   Volney American, [place] 03/20/22 1334

## 2022-03-20 NOTE — ED Triage Notes (Signed)
Pt states she started having symptoms of UTI last Saturday  Pt states she went to get some AZO and it worked for few days and this morning there was blood in her urine  Pt states her lower abdomin is pain with pressure and burning with frequency  Denies Fever

## 2022-03-23 LAB — URINE CULTURE: Culture: 60000 — AB

## 2022-03-26 ENCOUNTER — Encounter
Admission: RE | Disposition: A | Discharge status: Home / Self Care | Payer: Self-pay | Source: Ambulatory Visit | Attending: Gastroenterology

## 2022-03-26 ENCOUNTER — Encounter: Payer: Self-pay | Admitting: Gastroenterology

## 2022-03-26 ENCOUNTER — Ambulatory Visit: Payer: 59 | Admitting: Anesthesiology

## 2022-03-26 ENCOUNTER — Other Ambulatory Visit: Payer: Self-pay

## 2022-03-26 ENCOUNTER — Ambulatory Visit
Admission: RE | Admit: 2022-03-26 | Discharge: 2022-03-26 | Disposition: A | Discharge status: Home / Self Care | Payer: 59 | Source: Ambulatory Visit | Attending: Gastroenterology | Admitting: Gastroenterology

## 2022-03-26 DIAGNOSIS — Z1211 Encounter for screening for malignant neoplasm of colon: Secondary | ICD-10-CM | POA: Diagnosis present

## 2022-03-26 DIAGNOSIS — R6881 Early satiety: Secondary | ICD-10-CM | POA: Insufficient documentation

## 2022-03-26 DIAGNOSIS — G43909 Migraine, unspecified, not intractable, without status migrainosus: Secondary | ICD-10-CM | POA: Diagnosis not present

## 2022-03-26 DIAGNOSIS — F419 Anxiety disorder, unspecified: Secondary | ICD-10-CM | POA: Diagnosis not present

## 2022-03-26 DIAGNOSIS — K640 First degree hemorrhoids: Secondary | ICD-10-CM | POA: Insufficient documentation

## 2022-03-26 DIAGNOSIS — Z800 Family history of malignant neoplasm of digestive organs: Secondary | ICD-10-CM | POA: Diagnosis not present

## 2022-03-26 DIAGNOSIS — K2960 Other gastritis without bleeding: Secondary | ICD-10-CM | POA: Insufficient documentation

## 2022-03-26 DIAGNOSIS — K625 Hemorrhage of anus and rectum: Secondary | ICD-10-CM | POA: Diagnosis not present

## 2022-03-26 DIAGNOSIS — Z6830 Body mass index (BMI) 30.0-30.9, adult: Secondary | ICD-10-CM | POA: Insufficient documentation

## 2022-03-26 DIAGNOSIS — E669 Obesity, unspecified: Secondary | ICD-10-CM | POA: Diagnosis not present

## 2022-03-26 DIAGNOSIS — Z01818 Encounter for other preprocedural examination: Secondary | ICD-10-CM

## 2022-03-26 DIAGNOSIS — Z8379 Family history of other diseases of the digestive system: Secondary | ICD-10-CM | POA: Diagnosis not present

## 2022-03-26 HISTORY — PX: COLONOSCOPY: SHX5424

## 2022-03-26 HISTORY — PX: ESOPHAGOGASTRODUODENOSCOPY: SHX5428

## 2022-03-26 SURGERY — COLONOSCOPY
Anesthesia: General | Site: Rectum

## 2022-03-26 MED ORDER — LIDOCAINE HCL (CARDIAC) PF 100 MG/5ML IV SOSY
PREFILLED_SYRINGE | INTRAVENOUS | Status: DC | PRN
  Administered 2022-03-26: 40 mg via INTRAVENOUS

## 2022-03-26 MED ORDER — ONDANSETRON HCL 4 MG/2ML IJ SOLN
INTRAMUSCULAR | Status: DC | PRN
  Administered 2022-03-26: 8 mg via INTRAVENOUS

## 2022-03-26 MED ORDER — GLYCOPYRROLATE 0.2 MG/ML IJ SOLN
INTRAMUSCULAR | Status: DC | PRN
  Administered 2022-03-26: .1 mg via INTRAVENOUS

## 2022-03-26 MED ORDER — LACTATED RINGERS IV SOLN: INTRAVENOUS | Status: DC

## 2022-03-26 MED ORDER — STERILE WATER FOR IRRIGATION IR SOLN
Status: DC | PRN
  Administered 2022-03-26: 1

## 2022-03-26 MED ORDER — STERILE WATER FOR IRRIGATION IR SOLN
Status: DC | PRN
  Administered 2022-03-26 (×2): 60 mL

## 2022-03-26 MED ORDER — SODIUM CHLORIDE 0.9 % IV SOLN: INTRAVENOUS | Status: DC

## 2022-03-26 MED ORDER — PROPOFOL 10 MG/ML IV BOLUS
INTRAVENOUS | Status: DC | PRN
  Administered 2022-03-26: 50 mg via INTRAVENOUS
  Administered 2022-03-26: 30 mg via INTRAVENOUS
  Administered 2022-03-26: 40 mg via INTRAVENOUS
  Administered 2022-03-26 (×2): 70 mg via INTRAVENOUS

## 2022-03-26 SURGICAL SUPPLY — 8 items
BLOCK BITE 60FR ADLT L/F GRN (MISCELLANEOUS) ×3 IMPLANT
FORCEPS BIOP RAD 4 LRG CAP 4 (CUTTING FORCEPS) ×1 IMPLANT
GOWN CVR UNV OPN BCK APRN NK (MISCELLANEOUS) ×8 IMPLANT
GOWN ISOL THUMB LOOP REG UNIV (MISCELLANEOUS) ×12
KIT PRC NS LF DISP ENDO (KITS) ×4 IMPLANT
KIT PROCEDURE OLYMPUS (KITS) ×6
MANIFOLD NEPTUNE II (INSTRUMENTS) ×6 IMPLANT
WATER STERILE IRR 250ML POUR (IV SOLUTION) ×6 IMPLANT

## 2022-03-26 NOTE — Transfer of Care (Signed)
Immediate Anesthesia Transfer of Care Note  Patient: Janice Willis  Procedure(s) Performed: COLONOSCOPY (Rectum) ESOPHAGOGASTRODUODENOSCOPY (EGD) WITH BIOPSY (Mouth)  Patient Location: PACU  Anesthesia Type: General  Level of Consciousness: awake, alert  and patient cooperative  Airway and Oxygen Therapy: Patient Spontanous Breathing and Patient connected to supplemental oxygen  Post-op Assessment: Post-op Vital signs reviewed, Patient's Cardiovascular Status Stable, Respiratory Function Stable, Patent Airway and No signs of Nausea or vomiting  Post-op Vital Signs: Reviewed and stable  Complications: No notable events documented.

## 2022-03-26 NOTE — Anesthesia Preprocedure Evaluation (Signed)
Anesthesia Evaluation  Patient identified by MRN, date of birth, ID band Patient awake    Reviewed: Allergy & Precautions, NPO status , Patient's Chart, lab work & pertinent test results, reviewed documented beta blocker date and time   History of Anesthesia Complications (+) PONV and history of anesthetic complications  Airway Mallampati: III  TM Distance: >3 FB Neck ROM: Full    Dental   Pulmonary    breath sounds clear to auscultation       Cardiovascular (-) angina(-) DOE  Rhythm:Regular Rate:Normal     Neuro/Psych  Headaches, PSYCHIATRIC DISORDERS Anxiety    GI/Hepatic neg GERD  , Celiac   Endo/Other    Renal/GU      Musculoskeletal   Abdominal (+) + obese (BMI 31),   Peds  Hematology   Anesthesia Other Findings   Reproductive/Obstetrics                             Anesthesia Physical Anesthesia Plan  ASA: 3  Anesthesia Plan: General   Post-op Pain Management:    Induction: Intravenous  PONV Risk Score and Plan: 3 and Propofol infusion, TIVA, Treatment may vary due to age or medical condition and Ondansetron  Airway Management Planned: Natural Airway and Nasal Cannula  Additional Equipment:   Intra-op Plan:   Post-operative Plan:   Informed Consent: I have reviewed the patients History and Physical, chart, labs and discussed the procedure including the risks, benefits and alternatives for the proposed anesthesia with the patient or authorized representative who has indicated his/her understanding and acceptance.       Plan Discussed with: CRNA and Anesthesiologist  Anesthesia Plan Comments:         Anesthesia Quick Evaluation

## 2022-03-26 NOTE — Interval H&P Note (Signed)
Janice Lame, MD [address] [place], [place] [address] Phone:[phone] Fax : [phone]  Primary Care Physician:  Despina Pole, [place] Primary Gastroenterologist:  Dr. Allen Norris  Pre-Procedure History & Physical: HPI:  Janice Willis is a 51 y.o. female is here for an endoscopy and colonoscopy.   Past Medical History:  Diagnosis Date   Anxiety state    Celiac disease    Dysfunction of eustachian tube    Migraine headache    Mitral valve disorder     Past Surgical History:  Procedure Laterality Date   ABDOMINAL HYSTERECTOMY     ANKLE SURGERY Left    x2   TONSILLECTOMY      Prior to Admission medications   Medication Sig Start Date End Date Taking? Authorizing Provider  Atogepant (QULIPTA) 60 MG TABS Take 60 mg by mouth daily. 02/19/22  Yes Genia Harold, MD  buPROPion [facility] SR) 150 MG 12 hr tablet Take by mouth. 08/15/20  Yes [provider]  cyclobenzaprine (FLEXERIL) 10 MG tablet Take one tablet once or twice daily for muscle spasms. 01/02/20  Yes Hyatt, Max T, DPM  FLUoxetine (PROZAC) 20 MG capsule Take 20 mg by mouth daily. 01/23/22  Yes [provider]  fluticasone (FLONASE) 50 MCG/ACT nasal spray INSTILL 2 SPRAYS IN EACH NOSTRIL ONCE DAILY 01/31/20  Yes [provider]  hydrochlorothiazide (HYDRODIURIL) 25 MG tablet Take 25 mg by mouth daily. 06/22/21  Yes [provider]  ibuprofen (ADVIL) 800 MG tablet Take by mouth.   Yes [provider]  montelukast (SINGULAIR) 10 MG tablet Take by mouth. 05/27/21  Yes [provider]  nitrofurantoin, macrocrystal-monohydrate, (MACROBID) 100 MG capsule Take 1 capsule (100 mg total) by mouth 2 (two) times daily. 03/20/22  Yes Volney American, PA-C  omeprazole (PRILOSEC) 20 MG capsule Take by mouth. 05/27/21  Yes [provider]  lubiprostone (AMITIZA) 24 MCG capsule Take 1 capsule (24 mcg total) by mouth 2 (two) times daily with a meal. 03/12/22   Janice Lame, MD  ondansetron (ZOFRAN) 4 MG tablet Take 1 tablet (4 mg total) by mouth every 8 (eight) hours as needed. 09/04/20   Hyatt, Max T, DPM    Allergies as of 03/12/2022 - Review Complete 03/12/2022  Allergen Reaction Noted   Codeine Rash 07/27/2019   Methylprednisolone Hives 06/03/2017   Penicillins Rash 04/20/2011   Venlafaxine  04/20/2011   Ciprofloxacin  04/20/2011   Clindamycin/lincomycin Itching 04/20/2011   Hydrocodone-acetaminophen Hives 07/27/2019   Percocet [oxycodone-acetaminophen] Swelling 12/14/2019   Prednisone Hives 04/20/2011   Propoxyphene  04/20/2011   Sulfamethoxazole-trimethoprim Rash 04/20/2011    Family History  Problem Relation Age of Onset   Cancer Mother 15       colon   Diabetes Brother    Bipolar disorder Brother    Mental retardation Brother     Social History   Socioeconomic History   Marital status: Married    Spouse name: Not on file   Number of children: 1   Years of education: 12   Highest education level: Not on file  Occupational History   Not on file  Tobacco Use   Smoking status: Never   Smokeless tobacco: Never  Vaping Use   Vaping Use: Never used  Substance and Sexual Activity   Alcohol use: Not Currently   Drug use: Never   Sexual activity: Yes  Other Topics Concern   Not on file  Social History Narrative   Lives with husband   \  caffeine- maybe 1 12 oz soda   Social Determinants of Health   Financial Resource Strain: Not on file  Food Insecurity: Not on file  Transportation Needs: Not on file  Physical Activity: Not on file  Stress: Not on file  Social Connections: Not on file  Intimate Partner Violence: Not on file    Review of Systems: See HPI, otherwise negative ROS  Physical Exam: BP 134/75   Pulse 85   Temp 97.9 F (36.6 C) (Temporal)   Resp 18   Ht 5' 2.25" (1.581 m)   Wt 75.3 kg   SpO2 98%   BMI 30.12 kg/m  General:   Alert,  pleasant and cooperative in NAD Head:  Normocephalic and  atraumatic. Neck:  Supple; no masses or thyromegaly. Lungs:  Clear throughout to auscultation.    Heart:  Regular rate and rhythm. Abdomen:  Soft, nontender and nondistended. Normal bowel sounds, without guarding, and without rebound.   Neurologic:  Alert and  oriented x4;  grossly normal neurologically.  Impression/Plan: Janice Willis is here for an endoscopy and colonoscopy to be performed for early satiety and rectal bleeding  Risks, benefits, limitations, and alternatives regarding  endoscopy and colonoscopy have been reviewed with the patient.  Questions have been answered.  All parties agreeable.   Janice Lame, MD  03/26/2022, 10:05 AM

## 2022-03-26 NOTE — Op Note (Signed)
[facility] Gastroenterology Patient Name: Janice MangoLisa Willis Procedure Date: 03/26/2022 10:06 AM MRN: [id number] Account #: [id number] Date of Birth: 1971/10/16 Admit Type: Outpatient Age: 51 Room: [facility]OM 01 Gender: Female Note Status: Finalized Instrument Name: [id number] Procedure:             Colonoscopy Indications:           Screening for colorectal malignant neoplasm, Family                         history of colon cancer in a first-degree relative                         before age 50 years Providers:             Midge Miniumarren Rilley Poulter MD, MD Referring MD:          Fredia BeetsBrais PA Medicines:             Propofol per Anesthesia Complications:         No immediate complications. Procedure:             Pre-Anesthesia Assessment:                        - Prior to the procedure, a History and Physical was                         performed, and patient medications and allergies were                         reviewed. The patient's tolerance of previous                         anesthesia was also reviewed. The risks and benefits                         of the procedure and the sedation options and risks                         were discussed with the patient. All questions were                         answered, and informed consent was obtained. Prior                         Anticoagulants: The patient has taken no previous                         anticoagulant or antiplatelet agents. ASA Grade                         Assessment: II - A patient with mild systemic disease.                         After reviewing the risks and benefits, the patient                         was deemed in satisfactory condition to undergo the  procedure.                        After obtaining informed consent, the colonoscope was                         passed under direct vision. Throughout the procedure,                         the patient's blood pressure, pulse, and oxygen                          saturations were monitored continuously. The                         Colonoscope was introduced through the anus and                         advanced to the the cecum, identified by appendiceal                         orifice and ileocecal valve. The colonoscopy was                         performed without difficulty. The patient tolerated                         the procedure well. The quality of the bowel                         preparation was excellent. Findings:      The perianal and digital rectal examinations were normal.      Non-bleeding internal hemorrhoids were found during retroflexion. The       hemorrhoids were Grade I (internal hemorrhoids that do not prolapse). Impression:            - Non-bleeding internal hemorrhoids.                        - No specimens collected. Recommendation:        - Discharge patient to home.                        - Resume previous diet.                        - Continue present medications.                        - Repeat colonoscopy in 5 years for surveillance. Procedure Code(s):     --- Professional ---                        [phone], Colonoscopy, flexible; diagnostic, including                         collection of specimen(s) by brushing or washing, when                         performed (separate procedure) Diagnosis Code(s):     --- Professional ---  Z12.11, Encounter for screening for malignant neoplasm                         of colon CPT copyright 2019 American Medical Association. All rights reserved. The codes documented in this report are preliminary and upon coder review may  be revised to meet current compliance requirements. Midge Minium MD, MD 03/26/2022 10:33:46 AM This report has been signed electronically. Number of Addenda: 0 Note Initiated On: 03/26/2022 10:06 AM Scope Withdrawal Time: 0 hours 5 minutes 9 seconds  Total Procedure Duration: 0 hours 10 minutes 38 seconds  Estimated  Blood Loss:  Estimated blood loss: none.      [facility]

## 2022-03-26 NOTE — Anesthesia Postprocedure Evaluation (Signed)
Anesthesia Post Note  Patient: Janice Willis  Procedure(s) Performed: COLONOSCOPY (Rectum) ESOPHAGOGASTRODUODENOSCOPY (EGD) WITH BIOPSY (Mouth)     Patient location during evaluation: PACU Anesthesia Type: General Level of consciousness: awake and alert Pain management: pain level controlled Vital Signs Assessment: post-procedure vital signs reviewed and stable Respiratory status: spontaneous breathing, nonlabored ventilation, respiratory function stable and patient connected to nasal cannula oxygen Cardiovascular status: blood pressure returned to baseline and stable Postop Assessment: no apparent nausea or vomiting Anesthetic complications: no   No notable events documented.  Eudora Guevarra A  Lucely Leard

## 2022-03-26 NOTE — Op Note (Signed)
[facility] Gastroenterology Patient Name: Janice Willis Procedure Date: 03/26/2022 10:07 AM MRN: [id number] Account #: [id number] Date of Birth: 1971-04-06 Admit Type: Outpatient Age: 51 Room: [facility] OR ROOM 01 Gender: Female Note Status: Finalized Instrument Name: [id number] Procedure:             Upper GI endoscopy Indications:           Early satiety Providers:             Lucilla Lame MD, MD Referring MD:          Elmyra Ricks PA Medicines:             Propofol per Anesthesia Complications:         No immediate complications. Procedure:             Pre-Anesthesia Assessment:                        - Prior to the procedure, a History and Physical was                         performed, and patient medications and allergies were                         reviewed. The patient's tolerance of previous                         anesthesia was also reviewed. The risks and benefits                         of the procedure and the sedation options and risks                         were discussed with the patient. All questions were                         answered, and informed consent was obtained. Prior                         Anticoagulants: The patient has taken no previous                         anticoagulant or antiplatelet agents. ASA Grade                         Assessment: II - A patient with mild systemic disease.                         After reviewing the risks and benefits, the patient                         was deemed in satisfactory condition to undergo the                         procedure.                        After obtaining informed consent, the endoscope was  passed under direct vision. Throughout the procedure,                         the patient's blood pressure, pulse, and oxygen                         saturations were monitored continuously. The Endoscope                         was introduced through the mouth, and advanced to the                          second part of duodenum. The upper GI endoscopy was                         accomplished without difficulty. The patient tolerated                         the procedure well. Findings:      The examined esophagus was normal.      The entire examined stomach was normal. Biopsies were taken with a cold       forceps for Helicobacter pylori testing.      The examined duodenum was normal. Biopsies for histology were taken with       a cold forceps for evaluation of celiac disease. Impression:            - Normal esophagus.                        - Normal stomach. Biopsied.                        - Normal examined duodenum. Biopsied. Recommendation:        - Discharge patient to home.                        - Resume previous diet.                        - Continue present medications.                        - Await pathology results.                        - Perform a colonoscopy today. Procedure Code(s):     --- Professional ---                        [phone], Esophagogastroduodenoscopy, flexible,                         transoral; with biopsy, single or multiple Diagnosis Code(s):     --- Professional ---                        R68.81, Early satiety CPT copyright 2019 American Medical Association. All rights reserved. The codes documented in this report are preliminary and upon coder review may  be revised to meet current compliance requirements. Lucilla Lame MD, MD 03/26/2022 10:20:04 AM This report has been signed electronically. Number of Addenda: 0  Note Initiated On: 03/26/2022 10:07 AM Total Procedure Duration: 0 hours 4 minutes 6 seconds  Estimated Blood Loss:  Estimated blood loss: none.      [facility]

## 2022-03-27 ENCOUNTER — Encounter: Payer: Self-pay | Admitting: Gastroenterology

## 2022-03-30 LAB — SURGICAL PATHOLOGY

## 2022-03-31 ENCOUNTER — Encounter: Payer: Self-pay | Admitting: Gastroenterology

## 2022-04-01 ENCOUNTER — Telehealth: Payer: Self-pay

## 2022-04-01 ENCOUNTER — Other Ambulatory Visit: Payer: Self-pay | Admitting: Psychiatry

## 2022-04-01 DIAGNOSIS — K581 Irritable bowel syndrome with constipation: Secondary | ICD-10-CM | POA: Insufficient documentation

## 2022-04-01 MED ORDER — BOTOX 200 UNITS IJ SOLR: 200.0000 [IU] | INTRAMUSCULAR | 6 refills | Status: AC

## 2022-04-01 NOTE — Telephone Encounter (Signed)
PA has been submitted via [email or web address], awaiting response  °

## 2022-04-01 NOTE — Telephone Encounter (Signed)
Please send Botox RX to [facility]. 

## 2022-04-01 NOTE — Telephone Encounter (Signed)
Rx sent, thanks 

## 2022-04-06 MED ORDER — LINACLOTIDE 290 MCG PO CAPS: 290.0000 ug | ORAL_CAPSULE | Freq: Every day | ORAL | 1 refills | Status: AC

## 2022-04-06 NOTE — Addendum Note (Signed)
Addended by: Roena Malady on: 04/06/2022 11:33 AM   Modules accepted: Orders

## 2022-04-06 NOTE — Telephone Encounter (Signed)
received notification of the denial as well. There are not many options. I am going to try Linzess first using the coupon card to see if you qualify for the lowest co-pay. Dr. Servando Snare told me to send in the Linzess . In order to process the coupon card, I will need to send in a 90 days supply for the discount. I will get this sent in now and call the pharmacy to provide the coupon card information.

## 2022-04-06 NOTE — Addendum Note (Signed)
Addended by: Roena Malady on: 04/06/2022 11:28 AM   Modules accepted: Orders

## 2022-04-06 NOTE — Telephone Encounter (Signed)
Called [facility] and coupon card info given via telephone

## 2022-04-16 ENCOUNTER — Telehealth: Payer: Self-pay | Admitting: Psychiatry

## 2022-04-16 NOTE — Telephone Encounter (Signed)
[facility]) requesting verification for Botox directions. Would like a call back

## 2022-04-29 ENCOUNTER — Emergency Department: Payer: 59

## 2022-04-29 ENCOUNTER — Emergency Department
Admission: EM | Admit: 2022-04-29 | Discharge: 2022-04-29 | Disposition: A | Discharge status: Home / Self Care | Payer: 59 | Attending: Emergency Medicine | Admitting: Emergency Medicine

## 2022-04-29 ENCOUNTER — Other Ambulatory Visit: Payer: Self-pay

## 2022-04-29 DIAGNOSIS — R1011 Right upper quadrant pain: Secondary | ICD-10-CM | POA: Diagnosis not present

## 2022-04-29 DIAGNOSIS — R112 Nausea with vomiting, unspecified: Secondary | ICD-10-CM | POA: Diagnosis not present

## 2022-04-29 DIAGNOSIS — R197 Diarrhea, unspecified: Secondary | ICD-10-CM | POA: Diagnosis not present

## 2022-04-29 DIAGNOSIS — R109 Unspecified abdominal pain: Secondary | ICD-10-CM

## 2022-04-29 LAB — COMPREHENSIVE METABOLIC PANEL
ALT: 17 U/L (ref 0–44)
AST: 21 U/L (ref 15–41)
Albumin: 4.9 g/dL (ref 3.5–5.0)
Alkaline Phosphatase: 54 U/L (ref 38–126)
Anion gap: 13 (ref 5–15)
BUN: 8 mg/dL (ref 6–20)
CO2: 26 mmol/L (ref 22–32)
Calcium: 9.6 mg/dL (ref 8.9–10.3)
Chloride: 95 mmol/L — ABNORMAL LOW (ref 98–111)
Creatinine, Ser: 0.85 mg/dL (ref 0.44–1.00)
GFR, Estimated: >60 mL/min (ref >60)
Glucose, Bld: 97 mg/dL (ref 70–99)
Potassium: 2.7 mmol/L — CL (ref 3.5–5.1)
Sodium: 134 mmol/L — ABNORMAL LOW (ref 135–145)
Total Bilirubin: 0.5 mg/dL (ref 0.3–1.2)
Total Protein: 8.1 g/dL (ref 6.5–8.1)

## 2022-04-29 LAB — CBC
HCT: 42.9 % (ref 36.0–46.0)
Hemoglobin: 14.6 g/dL (ref 12.0–15.0)
MCH: 29.1 pg (ref 26.0–34.0)
MCHC: 34 g/dL (ref 30.0–36.0)
MCV: 85.6 fL (ref 80.0–100.0)
Platelets: 312 10*3/uL (ref 150–400)
RBC: 5.01 MIL/uL (ref 3.87–5.11)
RDW: 13.3 % (ref 11.5–15.5)
WBC: 6.4 10*3/uL (ref 4.0–10.5)
nRBC: 0 % (ref 0.0–0.2)

## 2022-04-29 LAB — LIPASE, BLOOD: Lipase: 47 U/L (ref 11–51)

## 2022-04-29 MED ORDER — ONDANSETRON HCL 4 MG/2ML IJ SOLN
4.0000 mg | Freq: Once | INTRAMUSCULAR | Status: AC
  Administered 2022-04-29: 4 mg via INTRAVENOUS
  Filled 2022-04-29: qty 2

## 2022-04-29 MED ORDER — PROMETHAZINE HCL 25 MG PO TABS
25.0000 mg | ORAL_TABLET | Freq: Four times a day (QID) | ORAL | 0 refills | Status: AC | PRN
Start: 2022-04-29 — End: ?

## 2022-04-29 MED ORDER — POTASSIUM CHLORIDE CRYS ER 20 MEQ PO TBCR
40.0000 meq | EXTENDED_RELEASE_TABLET | Freq: Once | ORAL | Status: AC
Start: 2022-04-29 — End: 2022-04-29
  Administered 2022-04-29: 40 meq via ORAL
  Filled 2022-04-29: qty 2

## 2022-04-29 MED ORDER — DICYCLOMINE HCL 10 MG PO CAPS: 10.0000 mg | ORAL_CAPSULE | Freq: Three times a day (TID) | ORAL | 0 refills | Status: AC | PRN

## 2022-04-29 MED ORDER — FENTANYL CITRATE PF 50 MCG/ML IJ SOSY
50.0000 ug | PREFILLED_SYRINGE | Freq: Once | INTRAMUSCULAR | Status: AC
  Administered 2022-04-29: 50 ug via INTRAVENOUS
  Filled 2022-04-29: qty 1

## 2022-04-29 MED ORDER — IOHEXOL 300 MG/ML  SOLN
100.0000 mL | Freq: Once | INTRAMUSCULAR | Status: AC | PRN
  Administered 2022-04-29: 100 mL via INTRAVENOUS

## 2022-04-29 MED ORDER — SODIUM CHLORIDE 0.9 % IV BOLUS
1000.0000 mL | Freq: Once | INTRAVENOUS | Status: AC
  Administered 2022-04-29: 1000 mL via INTRAVENOUS

## 2022-04-29 NOTE — ED Triage Notes (Signed)
Pt here with abd pain, diarrhea, N/V for 3 months but has gotten much worse. Pt states she has also been feeling weaker. Pt has been followed by GI but has not found out the reasoning for her symptoms/

## 2022-04-29 NOTE — Discharge Instructions (Signed)
Please seek medical attention for any high fevers, chest pain, shortness of breath, change in behavior, persistent vomiting, bloody stool or any other new or concerning symptoms.  

## 2022-04-29 NOTE — ED Provider Notes (Signed)
[facility] Provider Note    Event Date/Time   First MD Initiated Contact with Patient 04/29/22 1109     (approximate)   History   Abdominal Pain   HPI  Janice Coulon is a 51 y.o. female who presents to the emergency department today because of concerns for abdominal pain.  Pain is located in the right upper quadrant.  The patient states that the pain has been present for roughly 3 months.  It is companied by nausea vomiting and diarrhea.  Eating makes the pain worse.  Patient states that she has seen GI and has had recent endoscopy and colonoscopy performed without any clear etiology.  She states she also tested negative for H. pylori.  Physical Exam   Triage Vital Signs: ED Triage Vitals [04/29/22 0910]  Enc Vitals Group     BP (!) 141/88     Pulse Rate 98     Resp 16     Temp 98.2 F (36.8 C)     Temp Source Oral     SpO2 99 %     Weight 153 lb (69.4 kg)     Height 5\' 2"  (1.575 m)     Head Circumference      Peak Flow      Pain Score 8     Pain Loc      Pain Edu?      Excl. in GC?     Most recent vital signs: Vitals:   04/29/22 0910 04/29/22 1100  BP: (!) 141/88 134/81  Pulse: 98 77  Resp: 16 18  Temp: 98.2 F (36.8 C)   SpO2: 99% 99%    General: Awake, alert, oriented. CV:  Good peripheral perfusion. Regular rate and rhythm. Resp:  Normal effort. Lungs clear. Abd:  No distention. Tender to palpation in the RUQ.   ED Results / Procedures / Treatments   Labs (all labs ordered are listed, but only abnormal results are displayed) Labs Reviewed  COMPREHENSIVE METABOLIC PANEL - Abnormal; Notable for the following components:      Result Value   Sodium 134 (*)    Potassium 2.7 (*)    Chloride 95 (*)    All other components within normal limits  LIPASE, BLOOD  CBC  URINALYSIS, ROUTINE W REFLEX MICROSCOPIC  POC URINE PREG, ED     EKG  I, 06/30/22, attending physician, personally viewed and interpreted this  EKG  EKG Time: 0915 Rate: 97 Rhythm: normal sinus rhythm Axis: normal Intervals: qtc 457 QRS: narrow, q waves v1 ST changes: no st elevation Impression: abnormal ekg  RADIOLOGY I independently interpreted and visualized the RUQ 0916. My interpretation: no gallstones Radiology interpretation:  IMPRESSION:  No sonographic evidence of acute abnormality.    I independently interpreted and visualized the CT abd/pel. My interpretation: No free air.  Radiology interpretation:  IMPRESSION:  No acute abnormality of the abdomen or pelvis.    PROCEDURES:  Critical Care performed: No  Procedures   MEDICATIONS ORDERED IN ED: Medications - No data to display   IMPRESSION / MDM / ASSESSMENT AND PLAN / ED COURSE  I reviewed the triage vital signs and the nursing notes.                              Differential diagnosis includes, but is not limited to, gallbladder disease, gastritis, pancreatitis.  Patient's presentation is most consistent with acute presentation with potential  threat to life or bodily function.  Patient presented to the emergency department today with concern for right upper quadrant abdominal pain.  This has been going on for somewhat some time.  Patient has seen GI and has undergone colonoscopy and endoscopy.  Here in the emergency department she did have some right upper quadrant tenderness.  Did initially obtain ultrasound to evaluate for possible gallbladder disease.  Ultrasound was negative.  Then proceeded with CT scan which also did not show any clear etiology of the patient's pain.  At this time given CT and ultrasound findings I think it is reasonable for patient be discharged.  Encourage patient to continue outpatient work-up.  Will give patient medication to help with symptomatic treatment.  FINAL CLINICAL IMPRESSION(S) / ED DIAGNOSES   Final diagnoses:  Abdominal pain, unspecified abdominal location  Nausea and vomiting, unspecified vomiting type     Note:  This document was prepared using Dragon voice recognition software and may include unintentional dictation errors.    Phineas Semen, MD 04/29/22 [phone]

## 2022-04-29 NOTE — ED Notes (Signed)
Pt in bed sig other at bedside, pt reports 2/10 pain, pt states that she is ready to go home, pt verbalized understanding d/c and follow up, pt ambulatory from dpt.

## 2022-04-30 NOTE — Telephone Encounter (Signed)
Called [facility] gave them the needed information.

## 2022-05-25 ENCOUNTER — Ambulatory Visit: Payer: 59 | Admitting: Psychiatry

## 2022-05-28 ENCOUNTER — Telehealth: Payer: Self-pay | Admitting: *Deleted

## 2022-05-28 ENCOUNTER — Ambulatory Visit: Payer: 59 | Admitting: Psychiatry

## 2022-05-28 VITALS — BP 131/74 | HR 84

## 2022-05-28 DIAGNOSIS — G43119 Migraine with aura, intractable, without status migrainosus: Secondary | ICD-10-CM

## 2022-05-28 MED ORDER — ONABOTULINUMTOXINA 200 UNITS IJ SOLR
155.0000 [IU] | Freq: Once | INTRAMUSCULAR | Status: AC
  Administered 2022-05-28: 155 [IU] via INTRAMUSCULAR

## 2022-05-28 MED ORDER — NURTEC 75 MG PO TBDP
75.0000 mg | ORAL_TABLET | ORAL | 6 refills | Status: AC | PRN
Start: 2022-05-28 — End: ?

## 2022-05-28 NOTE — Progress Notes (Signed)
[facility]  BOTULINUM TOXIN INJECTION PROCEDURE NOTE  Patient: Janice Willis   MRN: [id number]  Indication: Chronic migraines   History: 51 year old female with a history of Celiac disease who has has migraines since she was 51 years old. She has done very well on Botox, with no migraines since her last session.  Last injection: 02/19/22  Technique: Informed consent was obtained and signed. 200 units onabotulinumtoxinA (Lot# [id number]; Expiration: 11/2024) were reconstituted using normal saline, to a concentration of 5 units per 0.43m. 155 units were injected using sterile technique across 31 sites as follows: corrugator 10, procerus 5 units, frontalis 20 units, temporalis 40 units, occipitalis 30 units, cervical paraspinal 20 units, trapezius 30 units. 45 units were wasted. Patient tolerated procedure without complication.  Prior Therapies                             Rescue: Toradol Excedirn Zomig 5 mg PRN Amerge 2.5 mg Maxalt Imitrex Phenergan Zofran Flexeril baclofen Ubrelvy Nurtec - helped  Prevention: Topamax - caused blurred vision Beta blocker - bradycardia Amitriptyline - lack of efficacy Emgality Aimovig Botox - helps  Qulipta  Plan: -Rescue: Nurtec 75 mg PRN -Return for Botox in 3 months  JGenia Harold8/10/23 3:18 PM

## 2022-05-28 NOTE — Progress Notes (Signed)
Botox- 200 units x 1 vial Lot: [id number] Expiration: 11/2024 NDC: 9290-9030-14   Bacteriostatic 0.9% Sodium Chloride- 73m total Lot: [id number]: 20 Jun 2023 NDC: [address]-44  Dx: [id number]

## 2022-05-28 NOTE — Telephone Encounter (Signed)
Nurtec PA< Key: [id number]. Your information has been sent to [facility].

## 2022-06-01 ENCOUNTER — Encounter: Payer: Self-pay | Admitting: *Deleted

## 2022-06-01 NOTE — Telephone Encounter (Signed)
The request for 8 NURTEC TAB 75MG ODT per 8 days is denied. This decision is based on health plan criteria for NURTEC TAB 75MG ODT. More than 8 tablets per month is covered only if: One of the following: (1) The drug is used for preventive treatment of episodic migraines. (2) The drug is used for acute treatment of migraine and you require treatment of more than eight migraines per month. The information provided does not show that you meet the criteria listed above. The PA was NOT entered for 8 tabs for 8 days. Per denial letter it was approved for 8 tabs for 30 days until 05/29/23.  Sent my chart to advise.

## 2022-06-16 ENCOUNTER — Ambulatory Visit: Payer: BC Managed Care – PPO | Admitting: Gastroenterology

## 2022-08-06 ENCOUNTER — Encounter: Payer: Self-pay | Admitting: Psychiatry

## 2022-08-20 ENCOUNTER — Ambulatory Visit: Payer: 59 | Admitting: Psychiatry

## 2022-09-01 ENCOUNTER — Telehealth: Payer: Self-pay | Admitting: Psychiatry

## 2022-09-01 NOTE — Telephone Encounter (Signed)
LVM and sent mychart msg informing pt of appointment change from 12/14 to 12/19 due to MD being out

## 2022-09-29 ENCOUNTER — Encounter: Payer: Self-pay | Admitting: Psychiatry

## 2022-09-29 NOTE — Telephone Encounter (Signed)
Addressed in [place] 09/29/22

## 2022-09-29 NOTE — Telephone Encounter (Signed)
Pt rescheduled Botox appt due to unable to come [facility] 12/19.  Reschedule appt with Judson Roch due to there was no availability with Dr.Chima

## 2022-10-01 ENCOUNTER — Ambulatory Visit: Payer: 59 | Admitting: Psychiatry

## 2022-10-06 ENCOUNTER — Ambulatory Visit: Payer: 59 | Admitting: Psychiatry

## 2022-10-07 ENCOUNTER — Telehealth: Payer: Self-pay | Admitting: Psychiatry

## 2022-10-07 NOTE — Telephone Encounter (Signed)
Called [facility] to schedule Botox delivery, rep Kristine Garbe states that rx is under medical review and delivery can be scheduled once this is complete. He recommends following up in 3-5 business days.

## 2022-10-14 NOTE — Telephone Encounter (Signed)
Called [facility] back and scheduled Botox delivery for 12/28.

## 2022-10-21 ENCOUNTER — Encounter: Payer: Self-pay | Admitting: Neurology

## 2022-10-22 ENCOUNTER — Ambulatory Visit: Payer: 59 | Admitting: Neurology

## 2022-11-02 ENCOUNTER — Ambulatory Visit
Admission: EM | Admit: 2022-11-02 | Discharge: 2022-11-02 | Disposition: A | Discharge status: Home / Self Care | Payer: 59 | Attending: Nurse Practitioner | Admitting: Nurse Practitioner

## 2022-11-02 DIAGNOSIS — R399 Unspecified symptoms and signs involving the genitourinary system: Secondary | ICD-10-CM

## 2022-11-02 LAB — POCT URINALYSIS DIP (MANUAL ENTRY)
Bilirubin, UA: NEGATIVE
Glucose, UA: NEGATIVE mg/dL
Ketones, POC UA: NEGATIVE mg/dL
Nitrite, UA: NEGATIVE
Protein Ur, POC: NEGATIVE mg/dL
Spec Grav, UA: 1.02 (ref 1.010–1.025)
Urobilinogen, UA: 0.2 E.U./dL
pH, UA: 5.5 (ref 5.0–8.0)

## 2022-11-02 MED ORDER — NITROFURANTOIN MONOHYD MACRO 100 MG PO CAPS: 100.0000 mg | ORAL_CAPSULE | Freq: Two times a day (BID) | ORAL | 0 refills | Status: AC

## 2022-11-02 MED ORDER — PHENAZOPYRIDINE HCL 100 MG PO TABS: 100.0000 mg | ORAL_TABLET | Freq: Three times a day (TID) | ORAL | 0 refills | Status: AC | PRN

## 2022-11-02 NOTE — ED Triage Notes (Signed)
Pt reports increase urinary frequency and burning when urinating x 2 days.

## 2022-11-02 NOTE — Discharge Instructions (Addendum)
-  The urinalysis shows leukocytes which can indicate infection blood.  A urine culture has been ordered to ensure you have a urinary tract infection and if so, to ensure you are being treated with the appropriate antibiotic.  If the antibiotic needs to be changed or if the culture is negative, you will be contacted. -Take medications as prescribed. -Increase fluids. -Ibuprofen or Tylenol for pain, fever, or general discomfort. -Develop a toileting schedule that will allow you to toilet at least every 2 hours. -Avoid caffeine to include tea, soda, and coffee. -If sexually active, void at least 15 to 20 minutes after sexual intercourse. -Follow-up in the emergency department if you develop fever, chills, worsening abdominal pain, or other concerns.  -As discussed, please follow-up with your urologist discuss the recurrence of urinary tract infections over the past several months. Follow-up as needed.

## 2022-11-02 NOTE — ED Provider Notes (Signed)
[facility]    CSN: [id number] Arrival date & time: 11/02/22  0801      History   Chief Complaint Chief Complaint  Patient presents with   Dysuria    HPI Janice Willis is a 52 y.o. female.   The history is provided by the patient.   Presents with a 2-day history of urinary urgency, suprapubic pressure, and burning with urination.  Patient denies fever, chills, hematuria, decreased urine stream, low back pain, abdominal pain, nausea, vomiting, or diarrhea.  Patient reports that she has had at least 2 urinary tract infections over the past 60 days.  She states that she is currently under the care of a urologist as she has incontinence, difficulty emptying her bladder, and problems with her urinary meatus.  Patient states she is scheduled to have surgery.  Patient states she is scheduled for surgery sometime in May.  Patient states that the last time she was treated, she was treated with Keflex.  Denies history of kidney stones.  Also has a history of hysterectomy.  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      Home Medications    Prior to Admission medications   Medication Sig Start Date End Date Taking? Authorizing Provider  nitrofurantoin, macrocrystal-monohydrate, (MACROBID) 100 MG capsule Take 1 capsule (100 mg total) by mouth 2 (two) times daily. 11/02/22  Yes Jai Steil-Warren, Alda Lea, NP  phenazopyridine (PYRIDIUM) 100 MG tablet Take 1 tablet (100 mg total) by mouth 3 (three) times daily as needed for pain. 11/02/22  Yes Savi Lastinger-Warren, Alda Lea, NP  Botulinum Toxin Type A (BOTOX) 200 units SOLR Inject 200 Units as directed every 3 (three) months. 04/01/22   Genia Harold, MD  buPROPion (WELLBUTRIN SR) 150 MG 12 hr tablet Take by mouth. 08/15/20   [provider]  cyclobenzaprine (FLEXERIL) 10 MG tablet Take one tablet once or twice daily for muscle spasms. 01/02/20   Hyatt, Max T, DPM  dicyclomine (BENTYL) 10 MG capsule Take 1 capsule (10 mg total) by mouth 3 (three) times daily as needed (abd pain). 04/29/22   Nance Pear, MD  FLUoxetine (PROZAC) 20 MG capsule Take 20 mg by mouth daily. 01/23/22   [provider]  fluticasone (FLONASE) 50 MCG/ACT nasal spray INSTILL 2 SPRAYS IN EACH NOSTRIL ONCE DAILY 01/31/20   [provider]  hydrochlorothiazide (HYDRODIURIL) 25 MG tablet Take 25 mg by mouth daily. 06/22/21   [provider]  ibuprofen (ADVIL) 800 MG tablet Take by mouth.    [provider]  linaclotide Karlene Einstein) 290 MCG CAPS capsule Take 1 capsule (290 mcg total) by mouth daily before breakfast. 04/06/22   Midge Minium, MD  montelukast (SINGULAIR) 10 MG tablet Take by mouth. 05/27/21   [provider]  omeprazole (PRILOSEC) 20 MG capsule Take by mouth. 05/27/21   [provider]  ondansetron (ZOFRAN) 4 MG tablet Take 1  tablet (4 mg total) by mouth every 8 (eight) hours as needed. 09/04/20   Hyatt, Max T, DPM  promethazine (PHENERGAN) 25 MG tablet Take 1 tablet (25 mg total) by mouth every 6 (six) hours as needed for nausea or vomiting. 04/29/22   Phineas Semen, MD  Rimegepant Sulfate (NURTEC) 75 MG TBDP Take 75 mg by mouth as needed. Do not exceed more than 1 pill in 24 hours 05/28/22   Ocie Doyne, MD    Family History Family History  Problem Relation Age of Onset   Cancer Mother 1       colon   Diabetes Brother    Bipolar disorder Brother    Mental retardation Brother     Social History Social History   Tobacco Use   Smoking status: Never   Smokeless tobacco: Never  Vaping Use   Vaping Use: Never used  Substance Use Topics   Alcohol use: Not Currently   Drug use: Never     Allergies   Codeine, Methylprednisolone, Penicillins, Venlafaxine, Ciprofloxacin, Clindamycin/lincomycin, Hydrocodone-acetaminophen, Percocet [oxycodone-acetaminophen], Prednisone, Propoxyphene, and Sulfamethoxazole-trimethoprim   Review of Systems Review of Systems Per HPI  Physical Exam Triage Vital Signs ED Triage Vitals  Enc Vitals Group     BP 11/02/22 0810 115/60     Pulse Rate 11/02/22 0810 90     Resp 11/02/22 0810 16     Temp 11/02/22 0810 98.1 F (36.7 C)     Temp Source 11/02/22 0810 Oral     SpO2 11/02/22 0810 99 %     Weight --      Height --      Head Circumference --      Peak Flow --      Pain Score 11/02/22 0814 0     Pain Loc --      Pain Edu? --      Excl. in GC? --    No data found.  Updated Vital Signs BP 115/60 (BP Location: Right Arm)   Pulse 90   Temp 98.1 F (36.7 C) (Oral)   Resp 16   SpO2 99%   Visual Acuity Right Eye Distance:   Left Eye Distance:   Bilateral Distance:    Right Eye Near:   Left Eye Near:    Bilateral Near:     Physical Exam Vitals and nursing note reviewed.  Constitutional:      General: She is not in acute distress.    Appearance:  Normal appearance.  HENT:     Head: Normocephalic.  Eyes:     Extraocular Movements: Extraocular movements intact.     Pupils: Pupils are equal, round, and reactive to light.  Cardiovascular:     Rate and Rhythm: Normal rate and regular rhythm.     Pulses: Normal pulses.     Heart sounds: Normal heart sounds.  Pulmonary:     Effort: Pulmonary effort is normal.     Breath sounds:  Normal breath sounds.  Abdominal:     General: Bowel sounds are normal.     Palpations: Abdomen is soft.     Tenderness: There is abdominal tenderness in the suprapubic area. There is no right CVA tenderness or left CVA tenderness.  Musculoskeletal:     Cervical back: Normal range of motion.  Lymphadenopathy:     Cervical: No cervical adenopathy.  Skin:    General: Skin is warm and dry.  Neurological:     Mental Status: She is alert and oriented to person, place, and time.  Psychiatric:        Mood and Affect: Mood normal.        Behavior: Behavior normal.      UC Treatments / Results  Labs (all labs ordered are listed, but only abnormal results are displayed) Labs Reviewed  POCT URINALYSIS DIP (MANUAL ENTRY) - Abnormal; Notable for the following components:      Result Value   Clarity, UA hazy (*)    Blood, UA trace-intact (*)    Leukocytes, UA Small (1+) (*)    All other components within normal limits  URINE CULTURE    EKG   Radiology No results found.  Procedures Procedures (including critical care time)  Medications Ordered in UC Medications - No data to display  Initial Impression / Assessment and Plan / UC Course  I have reviewed the triage vital signs and the nursing notes.  Pertinent labs & imaging results that were available during my care of the patient were reviewed by me and considered in my medical decision making (see chart for details).  The patient is well-appearing, she is in no acute distress, vital signs are stable.  Suspect a urinary tract infection given  the presence of leukocytes and blood in her urine.  Will treat patient's symptoms while urine culture is pending.  Do not suspect pyelonephritis at this time as patient is afebrile, and has no CVA tenderness on exam.  Discussed with patient that we will start her on Macrobid 100 mg twice daily while culture is pending.  Supportive care recommendations were provided to the patient to include increasing her fluid intake, developing a toileting schedule, and voiding after sexual intercourse.  Patient was advised to follow-up with urology to discuss recurrent urinary tract infections and to determine if additional treatment is necessary for recurrent see prior to her surgery.  Patient was given strict ER precautions.  Patient verbalizes understanding.  All questions were answered.  Patient stable for discharge.  Work note was provided.  Final Clinical Impressions(s) / UC Diagnoses   Final diagnoses:  Urinary tract infection symptoms     Discharge Instructions      -The urinalysis shows leukocytes which can indicate infection blood.  A urine culture has been ordered to ensure you have a urinary tract infection and if so, to ensure you are being treated with the appropriate antibiotic.  If the antibiotic needs to be changed or if the culture is negative, you will be contacted. -Take medications as prescribed. -Increase fluids. -Ibuprofen or Tylenol for pain, fever, or general discomfort. -Develop a toileting schedule that will allow you to toilet at least every 2 hours. -Avoid caffeine to include tea, soda, and coffee. -If sexually active, void at least 15 to 20 minutes after sexual intercourse. -Follow-up in the emergency department if you develop fever, chills, worsening abdominal pain, or other concerns.  -As discussed, please follow-up with your urologist discuss the recurrence of urinary tract infections  over the past several months. Follow-up as needed.    ED Prescriptions     Medication  Sig Dispense Auth. Provider   nitrofurantoin, macrocrystal-monohydrate, (MACROBID) 100 MG capsule Take 1 capsule (100 mg total) by mouth 2 (two) times daily. 10 capsule Aubria Vanecek-Warren, Sadie Haber, NP   phenazopyridine (PYRIDIUM) 100 MG tablet Take 1 tablet (100 mg total) by mouth 3 (three) times daily as needed for pain. 10 tablet Bruin Bolger-Warren, Sadie Haber, NP      PDMP not reviewed this encounter.   Abran Cantor, NP 11/02/22 [phone]

## 2022-11-04 LAB — URINE CULTURE: Culture: 30000 — AB

## 2022-11-10 ENCOUNTER — Ambulatory Visit: Payer: 59 | Admitting: Neurology

## 2022-11-14 ENCOUNTER — Encounter (INDEPENDENT_AMBULATORY_CARE_PROVIDER_SITE_OTHER): Payer: 59 | Admitting: Psychiatry

## 2022-11-14 DIAGNOSIS — G43719 Chronic migraine without aura, intractable, without status migrainosus: Secondary | ICD-10-CM

## 2022-11-17 NOTE — Telephone Encounter (Signed)

## 2022-11-19 ENCOUNTER — Ambulatory Visit: Payer: 59 | Admitting: Psychiatry

## 2023-01-14 ENCOUNTER — Ambulatory Visit: Payer: 59 | Admitting: Neurology

## 2023-01-25 ENCOUNTER — Encounter: Payer: Self-pay | Admitting: Psychiatry

## 2023-01-26 ENCOUNTER — Telehealth: Payer: Self-pay | Admitting: *Deleted

## 2023-01-26 NOTE — Telephone Encounter (Signed)
Can we get her set up for Botox injections? She can be scheduled with an NP if needed since I'll be out soon. I'm not sure if she needs a new prior auth or not, her last session was in August 2023

## 2023-01-26 NOTE — Telephone Encounter (Signed)
Needs Botox auth. Restart   Chronic Migraine CPT 64615    Botox [id number] Units:200   G43.119 Intractable migraine with aura without status migrainosus

## 2023-01-29 ENCOUNTER — Other Ambulatory Visit (HOSPITAL_COMMUNITY): Payer: Self-pay

## 2023-01-29 NOTE — Telephone Encounter (Signed)
   Benefit Verification [id number] Submitted! Botox One

## 2023-02-01 ENCOUNTER — Encounter: Payer: Self-pay | Admitting: Psychiatry

## 2023-02-01 NOTE — Telephone Encounter (Signed)
Please call pt to schedule botox appt, thank you

## 2023-02-01 NOTE — Telephone Encounter (Signed)
Pt has a bad debt of over $3,000 with our office, she will need to discuss with billing before we can schedule her. Sent MyChart msg informing pt.

## 2023-02-01 NOTE — Telephone Encounter (Signed)
It appears the PT has a current PA on file via medical benefits-Buy and [address]

## 2023-02-03 MED ORDER — QULIPTA 60 MG PO TABS
60.0000 mg | ORAL_TABLET | Freq: Every day | ORAL | 6 refills | Status: AC
Start: 2023-02-03 — End: ?

## 2023-02-11 ENCOUNTER — Encounter: Payer: Self-pay | Admitting: Psychiatry

## 2024-08-19 NOTE — Progress Notes (Signed)
 Subjective Janice Willis is a 53 y.o. female who presents for Sore Throat (x2 days Patient states very sore throat and ear pain, hurts to swallow) History of Present Illness The patient is a 53 year old female who presents for evaluation of throat and ear pain.  She reports experiencing pain in her throat and ears, which started concurrently yesterday. She also had a low-grade fever yesterday. She has not taken any ibuprofen  or Tylenol  today but did take some yesterday. She still has her tonsils. She is scheduled to return to work on Monday and is seeking a doctor's note for her employer.  Past Medical History:  Diagnosis Date  . Anxiety   . Blepharitis, unspecified 05/30/2009   Blepharitis In The Left Eye  10/1 IMO update  . Celiac disease (*)   . Depression   . Headache   . Heart murmur   . Migraine   . Peritonsillar abscess    History of Peritonsillar Abscess    Allergies[1]   Past Surgical History:  Procedure Laterality Date  . Colonoscopy  2019  . Colonoscopy  03/26/2022  . Hysterectomy    . Other surgical history     History of Tonsillectomy With Adenoidectomy  . Tonsillectomy  04/2002  . Upper gastrointestinal endoscopy  08/2018  . Upper gastrointestinal endoscopy  03/26/2022    Family History  Problem Relation Age of Onset  . Cancer Mother   . Colon cancer Mother 96       deceased  . Depression Father   . Schizophrenia Father   . No Known Problems Sister   . No Known Problems Sister   . Hypertension Brother   . Hyperlipidemia Brother   . Diabetes Brother   . Obesity Brother   . Bipolar disorder Brother   . Intellectual Disability  Brother   . Asthma Brother   . Depression Brother   . Drug abuse Brother   . No Known Problems Daughter   . Intellectual Disability  Maternal Uncle   . Colon cancer Maternal Uncle 74       deceased  . Intellectual Disability  Maternal Uncle   . Diabetes Paternal Grandmother   . Dementia Paternal Grandmother   . Other Other         Paternal history of Sudden Cardiac Arrest  . Other Other        Maternal grandmother's history of Coronary Artery Disease  . Colon polyps Neg Hx     Social History[2]   Review of Systems Review of Systems - All systems reviewed and are negative except what is noted in the HPI.    Objective Blood pressure 118/84, pulse 101, temperature 98.6 F (37 C), temperature source Oral, resp. rate 18, height 5' 2 (1.575 m), weight 196 lb 12.8 oz (89.3 kg), SpO2 98%, not currently breastfeeding. Physical Exam Ears: Erythematous bilaterally Mouth/Throat: Erythematous throat, likely from irritation General appearance: Alert, appears stated age, cooperative and no distress. Head: Normocephalic, without obvious abnormality, atraumatic. Eyes: Conjunctivae/corneas clear. PERRL, EOM's intact. No drainage.    Results Labs  - Strep test: Negative Laboratory Testing Recent Results (from the past 24 hours)  POCT Strep Nucleic Acid   Collection Time: 08/19/24  8:24 AM  Result Value Ref Range   Strep Negative Negative      Assessment & Plan 1. Bilateral otitis media, unspecified otitis media type  doxycycline  hyclate (VIBRA -TABS) 100 mg tablet    2. Sore throat  POCT Strep Nucleic Acid     1.  Ear infection: - Reports ear pain that started yesterday, accompanied by a low-grade fever. - Examination reveals erythematous ears. - A prescription for doxycycline , to be taken as one capsule twice daily for 10 days, has been sent to the pharmacy. - Advised to use over-the-counter decongestants, steam inhalation, warm drinks, and nasal sprays such as Flonase or saline.  2. Pharyngitis: - Reports throat pain that started yesterday. - Examination reveals a red throat, likely from irritation. Her strep test was negative. - Advised to use over-the-counter decongestants, steam inhalation, warm drinks, and nasal sprays such as Flonase or saline. - A doctor's note has been provided, recommending  rest and hydration until 08/24/2024, with the option to return to work earlier if her condition improves.     Patient's Medications       * Accurate as of August 19, 2024  9:46 AM. Reflects encounter med changes as of last refresh          New Prescriptions      Instructions  doxycycline  hyclate 100 mg tablet Commonly known as: VIBRA -TABS Started by: Rosaline Meo, FNP  100 mg, Oral, 2 times a day       Continued Medications      Instructions  BOTOX  200 units injection Generic drug: botulinum toxin type A   200 Units, Injection, Every 3 months   buPROPion 150 mg 12 hr tablet Commonly known as: WELLBUTRIN SR  150 mg, Oral, 2 times a day   fluticasone propionate 50 mcg/actuation nasal spray Commonly known as: FLONASE  2 sprays, Daily   hydroCHLOROthiazide 25 mg tablet  25 mg, Oral, Every morning   montelukast 10 MG tablet Commonly known as: SINGULAIR  10 mg, Oral, At bedtime   pantoprazole sodium 40 mg tablet Commonly known as: PROTONIX  40 mg, Oral, Daily       Modified Medications      Instructions  FLUoxetine 20 mg capsule Commonly known as: PROZAC What changed: how much to take  20 mg, Oral, Daily         Advised patient to follow up with their PCP or RTC this week if condition persists or if symptoms worsen go to the emergency department for evaluation.  Previsit planning was completed via snapshot and review of chart.  Patient/family verbalized to me that they understood what their problem is, what they need to do about it, and why it is important that they do it.  The patient/family voices understanding of all medications. No barriers to adherence were noted. Patient is taking all medications as prescribed and is tolerating well.  Plan for follow-up as discussed or as needed if any worsening symptoms or change in condition.  After Visit Summary was offered and/or provided via MyChart.  Attestation  Designer, fashion/clothing was used to  create visit note. Consent from the patient/caregiver was obtained prior to its use.      [1] Allergies Allergen Reactions  . Ciprofloxacin Hcl Rash  . Codeine Rash  . Effexor [Venlafaxine Hydrochloride]     Felt like Zombee  . Hydrocodone -Acetaminophen  Hives and Other    Other reaction(s): Other (See Comments)  . Medrol [Methylprednisolone] Hives    Medrol Dose Pack  . Methadone Other    Other reaction(s): Other  . Oxycodone -Acetaminophen  Swelling  . Penicillins Rash  . Propoxyphene And Methadone Other  . Prozac [Fluoxetine Hcl]     Flat affect  . Sulfamethoxazole W/Trimethoprim Hives  . Venlafaxine Other    Felt like Zombee  . Clindamycin/Lincomycin   .  Darvocet [Propoxyphene N-Acetaminophen ]   . Paxil   . Prednisone   [2] Social History Socioeconomic History  . Marital status: Married  . Number of children: 1  . Years of education: 38  Occupational History    Comment: [employer]  Tobacco Use  . Smoking status: Never  . Smokeless tobacco: Never  Vaping Use  . Vaping status: Never Used  Substance and Sexual Activity  . Alcohol use: Yes    Comment: social  . Drug use: No  . Sexual activity: Yes    Partners: Male    Birth control/protection: Surgical  Social History Narrative   Denied - History of Alcohol Use   Denied - History of Drug Use   Marital History - Single   Occupation: - Quarry Manager  *Some images could not be shown.

## 2024-08-20 ENCOUNTER — Encounter: Payer: Self-pay | Admitting: Emergency Medicine

## 2024-08-20 ENCOUNTER — Emergency Department
Admission: EM | Admit: 2024-08-20 | Discharge: 2024-08-20 | Disposition: A | Discharge status: Home / Self Care | Attending: Emergency Medicine | Admitting: Emergency Medicine

## 2024-08-20 ENCOUNTER — Emergency Department

## 2024-08-20 ENCOUNTER — Other Ambulatory Visit: Payer: Self-pay

## 2024-08-20 DIAGNOSIS — D72829 Elevated white blood cell count, unspecified: Secondary | ICD-10-CM | POA: Diagnosis not present

## 2024-08-20 DIAGNOSIS — J9801 Acute bronchospasm: Secondary | ICD-10-CM | POA: Insufficient documentation

## 2024-08-20 DIAGNOSIS — R059 Cough, unspecified: Secondary | ICD-10-CM | POA: Diagnosis present

## 2024-08-20 DIAGNOSIS — J069 Acute upper respiratory infection, unspecified: Secondary | ICD-10-CM | POA: Diagnosis not present

## 2024-08-20 LAB — COMPREHENSIVE METABOLIC PANEL WITH GFR
ALT: 22 U/L (ref 0–44)
AST: 23 U/L (ref 15–41)
Albumin: 4.3 g/dL (ref 3.5–5.0)
Alkaline Phosphatase: 84 U/L (ref 38–126)
Anion gap: 16 — ABNORMAL HIGH (ref 5–15)
BUN: 17 mg/dL (ref 6–20)
CO2: 25 mmol/L (ref 22–32)
Calcium: 9.4 mg/dL (ref 8.9–10.3)
Chloride: 100 mmol/L (ref 98–111)
Creatinine, Ser: 0.96 mg/dL (ref 0.44–1.00)
GFR, Estimated: >60 mL/min (ref >60)
Glucose, Bld: 128 mg/dL — ABNORMAL HIGH (ref 70–99)
Potassium: 3.8 mmol/L (ref 3.5–5.1)
Sodium: 141 mmol/L (ref 135–145)
Total Bilirubin: 0.6 mg/dL (ref 0.0–1.2)
Total Protein: 8.6 g/dL — ABNORMAL HIGH (ref 6.5–8.1)

## 2024-08-20 LAB — CBC WITH DIFFERENTIAL/PLATELET
Abs Immature Granulocytes: 0.04 K/uL (ref 0.00–0.07)
Basophils Absolute: 0 K/uL (ref 0.0–0.1)
Basophils Relative: 0 %
Eosinophils Absolute: 0.4 K/uL (ref 0.0–0.5)
Eosinophils Relative: 3 %
HCT: 39.8 % (ref 36.0–46.0)
Hemoglobin: 13.2 g/dL (ref 12.0–15.0)
Immature Granulocytes: 0 %
Lymphocytes Relative: 12 %
Lymphs Abs: 1.6 K/uL (ref 0.7–4.0)
MCH: 29.3 pg (ref 26.0–34.0)
MCHC: 33.2 g/dL (ref 30.0–36.0)
MCV: 88.4 fL (ref 80.0–100.0)
Monocytes Absolute: 0.8 K/uL (ref 0.1–1.0)
Monocytes Relative: 6 %
Neutro Abs: 9.9 K/uL — ABNORMAL HIGH (ref 1.7–7.7)
Neutrophils Relative %: 79 %
Platelets: 310 K/uL (ref 150–400)
RBC: 4.5 MIL/uL (ref 3.87–5.11)
RDW: 14.4 % (ref 11.5–15.5)
WBC: 12.8 K/uL — ABNORMAL HIGH (ref 4.0–10.5)
nRBC: 0.2 % (ref 0.0–0.2)

## 2024-08-20 LAB — RESP PANEL BY RT-PCR (RSV, FLU A&B, COVID)  RVPGX2
Influenza A by PCR: NEGATIVE
Influenza B by PCR: NEGATIVE
Resp Syncytial Virus by PCR: NEGATIVE
SARS Coronavirus 2 by RT PCR: NEGATIVE

## 2024-08-20 MED ORDER — ALBUTEROL SULFATE HFA 108 (90 BASE) MCG/ACT IN AERS: 2.0000 | INHALATION_SPRAY | RESPIRATORY_TRACT | 0 refills | Status: AC | PRN

## 2024-08-20 MED ORDER — DEXAMETHASONE 6 MG PO TABS
12.0000 mg | ORAL_TABLET | Freq: Once | ORAL | Status: AC
  Administered 2024-08-20: 12 mg via ORAL
  Filled 2024-08-20: qty 2

## 2024-08-20 MED ORDER — ALBUTEROL SULFATE (2.5 MG/3ML) 0.083% IN NEBU
2.5000 mg | INHALATION_SOLUTION | Freq: Once | RESPIRATORY_TRACT | Status: AC
  Administered 2024-08-20: 2.5 mg via RESPIRATORY_TRACT
  Filled 2024-08-20: qty 3

## 2024-08-20 NOTE — Discharge Instructions (Signed)
 You can finish the course of doxycycline  you were started on and use the other over-the-counter medications for cough.  Use the albuterol inhaler up to every 4 hours as needed for shortness of breath, wheezing, or chest tightness.  The steroid will continue to work in your body over the next couple of days so you do not need a prescription for it.  Return to the ER immediately for new, worsening, or persistent severe shortness of breath, chest pain or tightness, high fever, weakness, or any other new or worsening symptoms that concern you.

## 2024-08-20 NOTE — ED Triage Notes (Signed)
 Pt reports she has had URI symptoms for several days. Seen at [facility] for it and got antiobiotics started. Tonight she woke up and felt like she couldn't breathe. States when she coughs, feels like mucous is stuck and cannot breathe. Sats normal and is talking without issue. Voice hoarse.

## 2024-08-20 NOTE — ED Provider Notes (Signed)
 [facility] Provider Note    Event Date/Time   First MD Initiated Contact with Patient 08/20/24 0319     (approximate)   History   URI   HPI  Janice Willis is a 53 y.o. female with a history of migraine, depression, and anxiety who presents with acute onset of shortness of breath this morning.  The patient has had nasal congestion, rhinorrhea, sore throat, and bilateral ear pain for a couple of days.  She was seen at urgent care yesterday and started on doxycycline  for likely ear infection.  She has been taking over-the-counter medications as well.  She was feeling somewhat better last night and went to sleep.  However she woke up during the night with acute onset of shortness of breath and coughing.  She states it is somewhat better now.  She denies any chest pain.  I reviewed the past medical records.  The patient's most recent outpatient encounter was yesterday with family medicine for throat and ear pain.  She was started on doxycycline  for possible ear infection.   Physical Exam   Triage Vital Signs: ED Triage Vitals [08/20/24 0139]  Encounter Vitals Group     BP (!) 145/68     Girls Systolic BP Percentile      Girls Diastolic BP Percentile      Boys Systolic BP Percentile      Boys Diastolic BP Percentile      Pulse Rate (!) 103     Resp 18     Temp 98.3 F (36.8 C)     Temp Source Oral     SpO2 100 %     Weight      Height      Head Circumference      Peak Flow      Pain Score      Pain Loc      Pain Education      Exclude from Growth Chart     Most recent vital signs: Vitals:   08/20/24 0340 08/20/24 0430  BP:  134/69  Pulse:  94  Resp:  16  Temp:    SpO2: 100% 100%     General: Alert, relatively well-appearing, no distress.  CV:  Good peripheral perfusion.  Resp:  Normal effort.  Faint wheezing bilaterally. Abd:  No distention.  Other:  Oropharynx clear with no erythema or exudate.   ED Results / Procedures / Treatments    Labs (all labs ordered are listed, but only abnormal results are displayed) Labs Reviewed  COMPREHENSIVE METABOLIC PANEL WITH GFR - Abnormal; Notable for the following components:      Result Value   Glucose, Bld 128 (*)    Total Protein 8.6 (*)    Anion gap 16 (*)    All other components within normal limits  CBC WITH DIFFERENTIAL/PLATELET - Abnormal; Notable for the following components:   WBC 12.8 (*)    Neutro Abs 9.9 (*)    All other components within normal limits  RESP PANEL BY RT-PCR (RSV, FLU A&B, COVID)  [id number]     EKG  ED ECG REPORT I, Waylon Cassis, the attending physician, personally viewed and interpreted this ECG.  Date: 08/20/2024 EKG Time: 0427 Rate: 89 Rhythm: normal sinus rhythm QRS Axis: normal Intervals: Prolonged QTc ST/T Wave abnormalities: normal Narrative Interpretation: no evidence of acute ischemia   RADIOLOGY  Chest x-ray: I independently viewed and interpreted the images; there is no focal consolidation or edema  PROCEDURES:  Critical Care performed: No  Procedures   MEDICATIONS ORDERED IN ED: Medications  albuterol (PROVENTIL) (2.5 MG/3ML) 0.083% nebulizer solution 2.5 mg (2.5 mg Nebulization Given 08/20/24 0418)  dexamethasone (DECADRON) tablet 12 mg (12 mg Oral Given 08/20/24 0418)     IMPRESSION / MDM / ASSESSMENT AND PLAN / ED COURSE  I reviewed the triage vital signs and the nursing notes.  53 year old female with PMH as noted above with URI symptoms for a couple days started on doxycycline  yesterday, now presents with acute onset of shortness of breath that awoke her from sleep.  Her symptoms have improved somewhat.  She is overall relatively well-appearing although actively coughing during my exam.  Vital signs are normal except for borderline tachycardia.  Differential diagnosis includes, but is not limited to, viral URI, acute bronchitis, pneumonia.  There is no evidence of cardiac etiology.  EKG is nonischemic,  although does show a prolonged QTc.  CBC shows mild leukocytosis.  CMP is unremarkable.  Respiratory panel is negative.  Chest x-ray is clear.  We will give bronchodilators, steroid, and reassess.  Patient's presentation is most consistent with acute complicated illness / injury requiring diagnostic workup.  The patient is on the cardiac monitor to evaluate for evidence of arrhythmia and/or significant heart rate changes.   ----------------------------------------- 5:10 AM on 08/20/2024 -----------------------------------------  The patient is feeling significantly better after albuterol.  She is stable for discharge home.  I counseled her on the results of the workup and plan of care.  I gave strict return precautions, and she expressed understanding.   FINAL CLINICAL IMPRESSION(S) / ED DIAGNOSES   Final diagnoses:  Bronchospasm  Upper respiratory tract infection, unspecified type     Rx / DC Orders   ED Discharge Orders          Ordered    albuterol (VENTOLIN HFA) 108 (90 Base) MCG/ACT inhaler  Every 4 hours PRN        08/20/24 0509             Note:  This document was prepared using Dragon voice recognition software and may include unintentional dictation errors.    Jacolyn Pae, MD 08/20/24 [phone]
